# Patient Record
Sex: Male | Born: 1945 | Race: White | Hispanic: No | Marital: Married
Health system: Midwestern US, Community
[De-identification: ages and names within clinical notes are randomized; demographics above are authoritative.]

## PROBLEM LIST (undated history)

## (undated) DIAGNOSIS — C61 Malignant neoplasm of prostate: Principal | ICD-10-CM

## (undated) DIAGNOSIS — R972 Elevated prostate specific antigen [PSA]: Secondary | ICD-10-CM

---

## 2008-04-10 DEATH — deceased

## 2016-10-21 NOTE — Discharge Instructions (Signed)
Do NOT take the following medications on the morning of surgery ATORVASTATIN    TAKE the following medications the morning of your surgery FOLLOW UP WITH DR HULL ABOUT EYE DROPS FOR MORNING OF SURGERY    You may take your prescription pain medications.  You may take Tylenol (Acetaminophen) if needed for pain.    No Motrin, Ibuprofen, or Advil 24 hours prior to surgery, or longer if instructed by your surgeon.     No Aleve or Naprosyn 3 days prior to surgery, or longer if instructed by your surgeon.    Additional instructions FOLLOW ALL INSTRUCTIONS GIVEN TO YOU BY YOUR PHYSICIAN    You will receive a reminder call the day before surgery with your Same Day Surgery arrival time.    If you have specific questions, please call your surgeon.

## 2016-10-21 NOTE — H&P (Signed)
Comprehensive PreSurgical History and Physical      Name: Maurice Baker MRN: [id number] DOB: [birth year]    (Age-71 y.o.)     Date of Service: Pt seen/examined on 10/21/2016     Chief Complaint:  Epiretinal membrane, left eye    History Of Present Illness:    71 y.o. male who we are asked to see/evaluate by [name], MD for pre-operative evaluation prior to Procedure Notes:  [place] LEFT EYE  MAC ANESTHESIA.    Patient is established with Dr. [name] for epiretinal membrane, left eye. He complains of visions changes since his cataract surgery July 2018. He has elected for the above procedure.    Patient denies chest pain on exertion, palpitations, or SOB. Patient denies any recent fevers, illness, infection, or wounds.    Past Medical History:        Diagnosis Date   . Hyperlipidemia    . Seizure [facility])     as child     Past Surgical History:        Procedure Laterality Date   . CATARACT REMOVAL Left    . COLONOSCOPY     . RETINAL LASER Left        Medications Prior to Admission:    Prior to Admission medications    Medication Sig Start Date End Date Taking? Authorizing Provider   prednisoLONE acetate (PRED FORTE) 1 % ophthalmic suspension Place 1 drop into the left eye 4 times daily   Yes Historical Provider, MD   ketorolac (ACULAR) 0.5 % ophthalmic solution Place 1 drop into the left eye 4 times daily   Yes Historical Provider, MD   bimatoprost (LUMIGAN) 0.01 % SOLN ophthalmic drops Place 1 drop into both eyes nightly   Yes Historical Provider, MD   atorvastatin (LIPITOR) 10 MG tablet Take 10 mg by mouth daily   Yes Historical Provider, MD   Timolol (TIMOPTIC) 0.5 % (DAILY) SOLN ophthalmic solution 1 drop daily   Yes Historical Provider, MD   Polyvinyl Alcohol-Povidone (REFRESH OP) Apply to eye 2 times daily   Yes Historical Provider, MD       ANTICOAGULATION: No  CHRONIC STEROID USE:  No  CHRONIC NARCOTIC USE:  No    Allergies:  Patient has no known allergies.    If patient has opioid allergy, is it okay to take Acetaminophen: N/A      Social History:   TOBACCO:   reports that he has never smoked. He has never used smokeless tobacco.  ETOH:   reports that he drinks about 1.2 oz of alcohol per week .  History   Drug Use No        Family History:  History reviewed. No pertinent family history.    REVIEW OF SYSTEMS:  Pertinent positives as noted in the HPI. All other systems reviewed and negative.    PHYSICAL EXAM:  Vitals:  BP (!) 146/91   Pulse 56   Temp 98.7 F (37.1 C) (Temporal)   Resp 18   Ht _0  (1.854 m)   Wt 212 lb (96.2 kg)   SpO2 97%   BMI 27.97 kg/m   BMI Classification:  Overweight (BMI 25.0-29.9)  General appearance: No apparent distress, appears stated age and cooperative.  HEENT: Normal cephalic, atraumatic without obvious deformity. Pupils equal, round, and reactive to light.  Extra ocular muscles intact. Conjunctivae/corneas clear.  Neck: No jugular venous distention. Trachea midline.  Respiratory:  Normal respiratory effort. Clear to auscultation, bilaterally without Rales/Wheezes/Rhonchi.  Cardiovascular: Regular rate and rhythm with normal S1/S2 without murmurs, rubs or gallops.  Abdomen: Soft, non-tender, non-distended with normal bowel sounds.  Musculoskeletal: No clubbing, cyanosis or edema bilaterally. Full ROM of all extremities.   Skin: Skin color, texture, turgor normal.  No rashes or lesions.  Neurologic:  Neurovascularly intact without any focal sensory/motor deficits. Cranial nerves: II-XII intact, grossly non-focal.  Psychiatric: Alert and oriented, thought content appropriate, normal insight      Labs:   No results found for: WBC, HGB, HCT, MCV, PLT  No results found for: NA, K, CL, CO2, BUN, CREATININE, GLUCOSE, CALCIUM, PROT, LABALBU, BILITOT, ALKPHOS, AST, ALT, LABGLOM, GFRAA, AGRATIO, GLOB    Lee's Simple Cardiac Risk Index:  High-risk surgery (intraperitoneal, intrathoracic or  suprainguinal vascular surgery): no  Coronary artery disease: no  Congestive heart failure: no  History of cerebrovascular disease: no  Insulin treatment for diabetes mellitus: no  Preoperative serum creatinine > 2.44m/dL: no    Total:   Interpretation:  0 Points Class I   1 Point Class II   2 Points Class III   >=3 Points Class IV         OSA Screening (STOP-Bang)  DO NOT COMPLETE IF ALREADY DIAGNOSED WITH OSA  Do you snore loudly (loud enough to be heard through closed doors, or your bed partner elbows you for snoring at night)?no    Do you often feel tired, fatigued, or sleepy during the daytime (such as falling asleep during driving)?yes - 1 Point    Has anyone observed you stop breathing or choking/gasping during your sleep?no    Do you have or are being treated for high blood pressure? no    Body mass index more than 35 kg/m2? Body mass index is 27.97 kg/m. no    Age older than 71years old? 71y.o. yes - 1 Point    Neck size large? (measured around Adam's apple)  For male, is your shirt collar 17 inches or larger?  no  For male, is your shirt collar 16 inches or larger?no    Gender = Male? male yes - 1 Point    STOP BANG SCORE = 3      Information from PAT significant to anesthetic history as recorded by NMechele Claude PA-C  on 10/21/2016        Anesthesia Component:    Any problems with anesthesia?: Past General Anesthetic without complications  History of difficult intubation?: None reported  History of difficult IV access?: None reported  Family History of problems with anesthesia?: None noted   Risk Factors PONV:    Male: no  Motion sick or prior PONV: no  NonSmoker: Nonsmoker - (+1)  Opiods for Procedure: Yes (+1)            Airway and Dentition Patient PAT Education - Risk Assesment      ASA Score:ASA 2 - Social alcohol  Mallampati  PAT assesment: 1        TMD: >4cm  Mouth Size: WNL  Neck: WNL  Dentures: None  Partials: permanent upper partial  Overall Dentition: Teeth intact with none loose/chipped  per patient  MAC anesthesia planned      GERD:None      Frailty Risk Assessment: FRAILTY RISK IF >639years old - Pt is 71y.o.               No Risk Factors unless listed   Patient  received procedure specific education prior to surgery per nursing     Tobacco:Patient not a current smoker    ETOH:  reports that he drinks about 1.2 oz of alcohol per week .  Drug:   History   Drug Use No        Functional Capacity: (>4 METS implies low cardiovascular risk from surgery):  Climb a flight of stairs or walk up a hill (5.50 METs)    PAT Pain Score:None     Postop Pain Management Plan (Pain consult ordered?): Pain consult not indicated at this time              EKG:  I have reviewed the EKG if indicated and there is no concern for ischemic changes or significant heart block.    ECHO and VQ:MGQQ on file       ASSESSMENT/PLAN:  Patient is considered low/intermediate  risk for this low/intermediate risk procedure/surgery (Procedure Notes:  25 GAUGE PARS PLANA VITRECTOMY EPIRETINAL MEMBRANE DISSECTION WITHINJECTION OF TRIESENCE LEFT EYE  MAC ANESTHESIA) with no reducible risk factors. Based on the above evaluation, the benefits of the planned procedure likely exceed the risks.  The patient is medically optimized to proceed with the planned procedure without any further cardiopulmonary testing.    1)  Epiretinal membrane, left eye  Deferred to surgeon.    2)  Elevated BP  Repeat BP lower. Patient is anxious for procedure. Patient follows his BP with his PCP and he has never been on any BP medication.  BP Readings from Last 3 Encounters:   10/21/16 (!) 146/91     3)  HLD  Statin therapy.    Labs Ordered:  NO - NOT INDICATED FOR THIS PROCEDURE  ECG Ordered:  NO - NOT INDICATED FOR THIS PROCEDURE  Sleep Referral Ordered:  No, patient declined OSA referral at this time.        Electronically signed by: Mechele Claude, PA-C     Date: 10/21/2016 at 1:39 PM               PAT Protocol referenced includes:  1) Anesthesia Lab Protocol  Orders  2) Perioperative Cardiovascular Risk Assessment  3) Anesthesia Assessment   4) Pain Assessment and Acute Pain Service Consult (if appropriate)  5) Medical Clearance/Consult from Internal Medicine ([place])  6) Shower/Wash Order (for designated surgeries)  7) OSA Screen and Sleep Clinic Referral (if appropriate)

## 2016-10-21 NOTE — Other (Unsigned)
Patient Acct Nbr: [id number]   Primary AUTH/CERT:   [place] Name: [place] name: [facility]  Primary Insurance Group Number: [id number]  Primary Insurance Plan Type: Health  Primary Insurance Policy Number: [id number]

## 2016-10-21 NOTE — H&P (View-Only) (Signed)
Comprehensive PreSurgical History and Physical      Name: Maurice Baker MRN: [id number] DOB: 04-Dec-1945    (Age-71 y.o.)     Date of Service: Pt seen/examined on 10/21/2016     Chief Complaint:  Epiretinal membrane, left eye    History Of Present Illness:    71 y.o. male who we are asked to see/evaluate by [name], MD for pre-operative evaluation prior to Procedure Notes:  [name] LEFT EYE  MAC ANESTHESIA.    Patient is established with Dr. [name] for epiretinal membrane, left eye. He complains of visions changes since his cataract surgery July 2018. He has elected for the above procedure.    Patient denies chest pain on exertion, palpitations, or SOB. Patient denies any recent fevers, illness, infection, or wounds.    Past Medical History:        Diagnosis Date   ??? Hyperlipidemia    ??? Seizure ([name])     as child     Past Surgical History:        Procedure Laterality Date   ??? CATARACT REMOVAL Left    ??? COLONOSCOPY     ??? RETINAL LASER Left        Medications Prior to Admission:    Prior to Admission medications    Medication Sig Start Date End Date Taking? Authorizing Provider   prednisoLONE acetate (PRED FORTE) 1 % ophthalmic suspension Place 1 drop into the left eye 4 times daily   Yes Historical Provider, MD   ketorolac (ACULAR) 0.5 % ophthalmic solution Place 1 drop into the left eye 4 times daily   Yes Historical Provider, MD   bimatoprost (LUMIGAN) 0.01 % SOLN ophthalmic drops Place 1 drop into both eyes nightly   Yes Historical Provider, MD   atorvastatin (LIPITOR) 10 MG tablet Take 10 mg by mouth daily   Yes Historical Provider, MD   Timolol (TIMOPTIC) 0.5 % (DAILY) SOLN ophthalmic solution 1 drop daily   Yes Historical Provider, MD   Polyvinyl Alcohol-Povidone (REFRESH OP) Apply to eye 2 times daily   Yes Historical Provider, MD       ANTICOAGULATION: No  CHRONIC STEROID USE:  No  CHRONIC NARCOTIC USE:  No    Allergies:  Patient has no known allergies.    If patient has opioid allergy, is it okay to take Acetaminophen: N/A      Social History:   TOBACCO:   reports that he has never smoked. He has never used smokeless tobacco.  ETOH:   reports that he drinks about 1.2 oz of alcohol per week .  History   Drug Use No        Family History:  History reviewed. No pertinent family history.    REVIEW OF SYSTEMS:  Pertinent positives as noted in the HPI. All other systems reviewed and negative.    PHYSICAL EXAM:  Vitals:  BP (!) 146/91    Pulse 56    Temp 98.7 ??F (37.1 ??C) (Temporal)    Resp 18    Ht _0  (1.854 m)    Wt 212 lb (96.2 kg)    SpO2 97%    BMI 27.97 kg/m??   BMI Classification:  Overweight (BMI 25.0-29.9)  General appearance: No apparent distress, appears stated age and cooperative.  HEENT: Normal cephalic, atraumatic without obvious deformity. Pupils equal, round, and reactive to light.  Extra ocular muscles intact. Conjunctivae/corneas clear.  Neck: No  jugular venous distention. Trachea midline.  Respiratory:  Normal respiratory effort. Clear to auscultation, bilaterally without Rales/Wheezes/Rhonchi.  Cardiovascular: Regular rate and rhythm with normal S1/S2 without murmurs, rubs or gallops.  Abdomen: Soft, non-tender, non-distended with normal bowel sounds.  Musculoskeletal: No clubbing, cyanosis or edema bilaterally. Full ROM of all extremities.   Skin: Skin color, texture, turgor normal.  No rashes or lesions.  Neurologic:  Neurovascularly intact without any focal sensory/motor deficits. Cranial nerves: II-XII intact, grossly non-focal.  Psychiatric: Alert and oriented, thought content appropriate, normal insight      Labs:   No results found for: WBC, HGB, HCT, MCV, PLT  No results found for: NA, K, CL, CO2, BUN, CREATININE, GLUCOSE, CALCIUM, PROT, LABALBU, BILITOT, ALKPHOS, AST, ALT, LABGLOM, GFRAA, AGRATIO, GLOB    Lee's Simple Cardiac Risk Index:  High-risk surgery (intraperitoneal, intrathoracic or  suprainguinal vascular surgery): no  Coronary artery disease: no  Congestive heart failure: no  History of cerebrovascular disease: no  Insulin treatment for diabetes mellitus: no  Preoperative serum creatinine > 2.19m/dL: no    Total:   Interpretation:  0 Points Class I   1 Point Class II   2 Points Class III   >=3 Points Class IV         OSA Screening (STOP-Bang)  DO NOT COMPLETE IF ALREADY DIAGNOSED WITH OSA  Do you snore loudly (loud enough to be heard through closed doors, or your bed partner elbows you for snoring at night)?no    Do you often feel tired, fatigued, or sleepy during the daytime (such as falling asleep during driving)?yes - 1 Point    Has anyone observed you stop breathing or choking/gasping during your sleep?no    Do you have or are being treated for high blood pressure? no    Body mass index more than 35 kg/m2? Body mass index is 27.97 kg/m??. no    Age older than 71years old? 71y.o. yes - 1 Point    Neck size large? (measured around Adam's apple)  For male, is your shirt collar 17 inches or larger?  no  For male, is your shirt collar 16 inches or larger?no    Gender = Male? male yes - 1 Point    STOP BANG SCORE = 3      Information from PAT significant to anesthetic history as recorded by NMechele Claude PA-C  on 10/21/2016        Anesthesia Component:    Any problems with anesthesia?: Past General Anesthetic without complications  History of difficult intubation?: None reported  History of difficult IV access?: None reported  Family History of problems with anesthesia?: None noted   Risk Factors PONV:    Male: no  Motion sick or prior PONV: no  NonSmoker: Nonsmoker - (+1)  Opiods for Procedure: Yes (+1)            Airway and Dentition Patient PAT Education - Risk Assesment      ASA Score:ASA 2 - Social alcohol  Mallampati  PAT assesment: 1        TMD: >4cm  Mouth Size: WNL  Neck: WNL  Dentures: None  Partials: permanent upper partial  Overall Dentition: Teeth intact with none loose/chipped  per patient  MAC anesthesia planned      GERD:None      Frailty Risk Assessment: FRAILTY RISK IF >641years old - Pt is 71y.o.  No Risk Factors unless listed   Patient received procedure specific education prior to surgery per nursing     Tobacco:Patient not a current smoker    ETOH:  reports that he drinks about 1.2 oz of alcohol per week .  Drug:   History   Drug Use No        Functional Capacity: (>4 METS implies low cardiovascular risk from surgery):  Climb a flight of stairs or walk up a hill (5.50 METs)    PAT Pain Score:None     Postop Pain Management Plan (Pain consult ordered?): Pain consult not indicated at this time              EKG:  I have reviewed the EKG if indicated and there is no concern for ischemic changes or significant heart block.    ECHO and WF:UXNA on file       ASSESSMENT/PLAN:  Patient is considered low/intermediate  risk for this low/intermediate risk procedure/surgery (Procedure Notes:  25 GAUGE PARS PLANA VITRECTOMY EPIRETINAL MEMBRANE DISSECTION WITHINJECTION OF TRIESENCE LEFT EYE  MAC ANESTHESIA) with no reducible risk factors. Based on the above evaluation, the benefits of the planned procedure likely exceed the risks.  The patient is medically optimized to proceed with the planned procedure without any further cardiopulmonary testing.    1)  Epiretinal membrane, left eye  Deferred to surgeon.    2)  Elevated BP  Repeat BP lower. Patient is anxious for procedure. Patient follows his BP with his PCP and he has never been on any BP medication.  BP Readings from Last 3 Encounters:   10/21/16 (!) 146/91     3)  HLD  Statin therapy.    Labs Ordered:  NO - NOT INDICATED FOR THIS PROCEDURE  ECG Ordered:  NO - NOT INDICATED FOR THIS PROCEDURE  Sleep Referral Ordered:  No, patient declined OSA referral at this time.        Electronically signed by: Mechele Claude, PA-C     Date: 10/21/2016 at 1:39 PM               PAT Protocol referenced includes:  1) Anesthesia Lab Protocol  Orders  2) Perioperative Cardiovascular Risk Assessment  3) Anesthesia Assessment   4) Pain Assessment and Acute Pain Service Consult (if appropriate)  5) Medical Clearance/Consult from Internal Medicine ([place])  6) Shower/Wash Order (for designated surgeries)  7) OSA Screen and Sleep Clinic Referral (if appropriate)

## 2016-10-30 MED ORDER — FAMOTIDINE 20 MG PO TABS
20 MG | Freq: Once | ORAL | Status: AC
Start: 2016-10-30 — End: 2016-10-30
  Administered 2016-10-30: 14:00:00 20 mg via ORAL

## 2016-10-30 MED ORDER — OXYCODONE HCL 5 MG PO TABS
5 MG | ORAL | Status: DC | PRN
Start: 2016-10-30 — End: 2016-10-30

## 2016-10-30 MED ORDER — NORMAL SALINE FLUSH 0.9 % IV SOLN
0.9 % | INTRAVENOUS | Status: DC | PRN
Start: 2016-10-30 — End: 2016-10-30

## 2016-10-30 MED ORDER — HYDROMORPHONE HCL 1 MG/ML IJ SOLN
1 MG/ML | INTRAMUSCULAR | Status: DC | PRN
Start: 2016-10-30 — End: 2016-10-30

## 2016-10-30 MED ORDER — PROPOFOL 200 MG/20ML IV EMUL
200 | INTRAVENOUS | Status: AC
Start: 2016-10-30 — End: 2016-10-30

## 2016-10-30 MED ORDER — LACTATED RINGERS IV SOLN
INTRAVENOUS | Status: DC
Start: 2016-10-30 — End: 2016-10-30
  Administered 2016-10-30: 14:00:00 via INTRAVENOUS

## 2016-10-30 MED ORDER — FENTANYL CITRATE (PF) 100 MCG/2ML IJ SOLN
100 MCG/2ML | INTRAMUSCULAR | Status: DC | PRN
Start: 2016-10-30 — End: 2016-10-30

## 2016-10-30 MED ORDER — ACETAMINOPHEN 500 MG PO TABS
500 MG | Freq: Once | ORAL | Status: AC
Start: 2016-10-30 — End: 2016-10-30
  Administered 2016-10-30: 14:00:00 1000 mg via ORAL

## 2016-10-30 MED ORDER — LABETALOL HCL 5 MG/ML IV SOLN
5 MG/ML | INTRAVENOUS | Status: DC | PRN
Start: 2016-10-30 — End: 2016-10-30

## 2016-10-30 MED ORDER — HYDRALAZINE HCL 20 MG/ML IJ SOLN
20 MG/ML | INTRAMUSCULAR | Status: DC | PRN
Start: 2016-10-30 — End: 2016-10-30

## 2016-10-30 MED ORDER — PHENYLEPHRINE HCL 10 % OP SOLN
10 % | OPHTHALMIC | Status: AC
Start: 2016-10-30 — End: 2016-10-30
  Administered 2016-10-30 (×3): 1 [drp] via OPHTHALMIC

## 2016-10-30 MED ORDER — ONDANSETRON HCL 4 MG/2ML IJ SOLN
4 MG/2ML | Freq: Once | INTRAMUSCULAR | Status: DC | PRN
Start: 2016-10-30 — End: 2016-10-30

## 2016-10-30 MED ORDER — PROMETHAZINE HCL 25 MG/ML IJ SOLN
25 MG/ML | Freq: Once | INTRAMUSCULAR | Status: DC | PRN
Start: 2016-10-30 — End: 2016-10-30

## 2016-10-30 MED ORDER — ATROPINE SULFATE 1 % OP SOLN
1 % | OPHTHALMIC | Status: AC
Start: 2016-10-30 — End: 2016-10-30
  Administered 2016-10-30 (×3): 1 [drp] via OPHTHALMIC

## 2016-10-30 MED ORDER — NORMAL SALINE FLUSH 0.9 % IV SOLN
0.9 % | Freq: Two times a day (BID) | INTRAVENOUS | Status: DC
Start: 2016-10-30 — End: 2016-10-30

## 2016-10-30 MED ORDER — MEPERIDINE HCL 25 MG/ML IJ SOLN
25 MG/ML | INTRAMUSCULAR | Status: DC | PRN
Start: 2016-10-30 — End: 2016-10-30

## 2016-10-30 MED ORDER — ONDANSETRON HCL 4 MG/2ML IJ SOLN
4 | INTRAMUSCULAR | Status: AC
Start: 2016-10-30 — End: 2016-10-30

## 2016-10-30 MED ORDER — LIDOCAINE HCL (PF) 1 % IJ SOLN
1 % | Freq: Once | INTRAMUSCULAR | Status: DC | PRN
Start: 2016-10-30 — End: 2016-10-30

## 2016-10-30 MED ORDER — DIPHENHYDRAMINE HCL 50 MG/ML IJ SOLN
50 MG/ML | Freq: Once | INTRAMUSCULAR | Status: DC | PRN
Start: 2016-10-30 — End: 2016-10-30

## 2016-10-30 MED ORDER — CYCLOPENTOLATE HCL 2 % OP SOLN
2 % | OPHTHALMIC | Status: AC
Start: 2016-10-30 — End: 2016-10-30
  Administered 2016-10-30 (×3): 1 [drp] via OPHTHALMIC

## 2016-10-30 MED ORDER — ALPRAZOLAM 0.25 MG PO TBDP
0.25 MG | ORAL | Status: DC | PRN
Start: 2016-10-30 — End: 2016-10-30

## 2016-10-30 MED FILL — ISOPTO ATROPINE 1 % OP SOLN: 1 % | OPHTHALMIC | Qty: 5

## 2016-10-30 MED FILL — PHENYLEPHRINE HCL 10 % OP SOLN: 10 % | OPHTHALMIC | Qty: 5

## 2016-10-30 MED FILL — PROPOFOL 200 MG/20ML IV EMUL: 200 MG/20ML | INTRAVENOUS | Qty: 20

## 2016-10-30 MED FILL — MAPAP 500 MG PO TABS: 500 MG | ORAL | Qty: 2

## 2016-10-30 MED FILL — ONDANSETRON HCL 4 MG/2ML IJ SOLN: 4 MG/2ML | INTRAMUSCULAR | Qty: 2

## 2016-10-30 MED FILL — BSS IO SOLN: INTRAOCULAR | Qty: 500

## 2016-10-30 MED FILL — FAMOTIDINE 20 MG PO TABS: 20 MG | ORAL | Qty: 1

## 2016-10-30 MED FILL — VANCOMYCIN HCL 500 MG IV SOLR: 500 MG | INTRAVENOUS | Qty: 5

## 2016-10-30 MED FILL — CYCLOPENTOLATE HCL 2 % OP SOLN: 2 % | OPHTHALMIC | Qty: 2

## 2016-10-30 NOTE — Anesthesia Post-Procedure Evaluation (Signed)
s                                       Department of Anesthesiology                             Post Anesthesia Note    Name: Maurice Baker MRN: [id number] DOB: Apr 14, 1945    (Age-71 y.o.)     This note was authored with review of the notes of the PACU and/or Same day stay and/or direct communication with the patient with an anesthesia care provider.     POST  ANESTHESIA  EVALUATION    No data found.       BP (!) 141/85    Pulse 51    Temp 97.1 ??F (36.2 ??C) (Tympanic)    Resp 20    Ht 6\' 1"  (1.854 m)    Wt 212 lb (96.2 kg)    SpO2 100%    BMI 27.97 kg/m??      Pain Level: 0    Vital signs: Respiratory and Cardiovascular function within normal limits (See Nursing Record)  Level of consciousness: Patient awake/ Able to participate      Return to baseline mental status: Yes Airway status: Normal   Pain controlled: Yes Dental injury: No   Nausea/Vomiting controlled: Yes Complications: no   Well hydrated: Yes      Patient Experience comments: None expressed    Electronically signed by Darrall Dears, MD on 10/31/2016 at 8:15 AM

## 2016-10-30 NOTE — Progress Notes (Signed)
Patient arrived and ID verified. Vital signs stable. Call light in reach. Denies nausea and vomiting. Offered crackers and something to drink. Belonging returned to the patient.

## 2016-10-30 NOTE — Interval H&P Note (Signed)
I personally reviewed the H&P     I agree with the stated history and physical. There are no interval changes to add at this time. Proceed with planned surgery as scheduled.    PHYSICIAN ATTESTATION    I affirm that prior to performing the procedure noted herein, that I have discussed with my patient risks, benefits and alternatives.  In addition, I have confirmed:    [x]  The  Patient's identification   [x]  The procedure to be performed   [x]  The use of moderate sedation or anesthesia   [x]  The surgical site has been marked when applicable   [x]  The patient has been assessed pre-sedation/pre-procedure and there are no changes       Electronically signed by: [name], MD     Date: 10/30/2016 at 9:26 AM

## 2016-10-30 NOTE — Other (Unsigned)
Patient Acct Nbr: [id number]   Primary AUTH/CERT:   [place] Name: [place] name: [facility]  Primary Insurance Group Number: [id number]  Primary Insurance Plan Type: Health  Primary Insurance Policy Number: [id number]

## 2016-10-30 NOTE — Brief Op Note (Signed)
Department of Ophthalmology  Brief Operative Report    Patient Name: Maurice Baker  Date of Birth: 1945-12-03  MRN: [id number]    Pre-operative Diagnosis:  Epiretinal Membrane and OS    Post-operative Diagnosis: same as preoperative diagnosis    Procedure:  Injection triescence, PPV, ERMD, GFX and OS    Surgeon:  Julaine Fusi, MD     Assistant(s):    Anesthesia:  MAC    Estimated blood loss:  Minimal    Specimens:  None    Implants:  None      Findings:  None    Complications:  None    Condition:  stable    See dictated operative report for full details.    [name], MD

## 2016-10-30 NOTE — Discharge Instructions (Signed)
DO NOT:  1. Remove shield unless instructed to place eye drops  2. Bathe until seen by physician  3. Bend beneath waist or lift greater than 10 pounds  4. Take aspirin until seen by physician  5. Drive until directed by physician  Position: Any position post-op    Ok to take tylenol as directed as needed    Start home medications as directed by primary care physician    Follow up in office next day as directed.

## 2016-10-30 NOTE — Op Note (Signed)
Maurice Baker, Maurice Baker  MEDICAL RECORD #:         [id number]  ADMISSION DATE:           10/30/2016  SURGERY DATE:             10/30/2016  ACCOUNT #:                [id number]  DATE OF BIRTH:            12-28-1945  AGE:                      71  ADMITTING PHYSICIAN:      Marcello Moores P. [name], MD  ATTENDING PHYSICIAN:      Dorina Hoyer. [name], MD  DICTATING PHYSICIAN:      Dorina Hoyer. [name], MD                               OPERATIVE RECORD      Procedures:  PARS PLANA VITRECTOMY, EPIRETINAL MEMBRANE DISSECTION,  INTERNAL LIMITING MEMBRANE DISSECTION, INJECTION INTRAVITREAL  TRIAMCINOLONE TO THE LEFT EYE.    Preoperative Diagnosis:  Macular epiretinal membrane, secondary  macular edema of the left eye.    Postoperative Diagnosis:  Macular epiretinal membrane, secondary  macular edema of the left eye.    Anesthesia:  Monitored local.    Assistant:  Benedict Needy.    Description of Procedure:  The patient was taken to the operating room  in the recumbent position on eye cart.  He was given retrobulbar  anesthetic to the left eye consisting of a half and half mixture of 2%  Xylocaine and 0.75% Marcaine.  Left eye was then prepped and draped in  the normal sterile fashion for eye surgery.  With the aid of the  operating room microscope, a lid speculum was placed into the left  eye.  Using 25-gauge sutureless vitrectomy ports, an infusion line was  placed in the inferotemporal quadrant, 3.5 mm posterior to the limbus.  It was visualized in the anterior segment and then turned on.  Two  additional ports were created at 9:30 and 2:30 positions, again 3.5 mm  posterior to the limbus.  The Zeiss wide field viewing system was  placed into position.  Light pipe and vitrector introduced to the eye.  Core vitrectomy was then performed.  Following removal of the core  vitreous as noted to the posterior hyaloid, it was still attached over  the central retinal surface.  Suction port of vitreous cutter was used  to engage and  strip the hyaloid from the posterior retinal surface.  Residual vitreous was then trimmed back.  ICG dye was then instilled  onto the retinal surface and then immediately removed.  A Tano diamond  dusted membrane scraper was used to engage and elevate an edge of  epiretinal membrane tissue.  The ERM was then peeled off the central  macular surface using end grasping forceps.  Tano scraper was then  used to engage and elevate internal limiting membrane.  The ILM was  also peeled off the retinal surface using end grasping forceps.  Indirect ophthalmoscope was then used to inspect the peripheral  retina.  There was no evidence of any peripheral retinal breaks.  A  20% air-fluid exchange was performed and 4 mg of Kenalog was  then  injected into the vitreous cavity.  The plugs were then removed.  The  wounds were found to be airtight and the pressure was noted to be  within normal limits.  Maxitrol ointment was placed topically on the  eye.  Pressure dressing was applied.  The patient was then taken to  the recovery area.    Diskriter Job ID: [id number]        Dorina Hoyer. [name], MD    DOD:10/30/2016 12:50 P  TH/dsk  DOT:10/30/2016 01:20 P  Job Number: [id number]  Document Number: [id number]  cc:   Misk Galentine P. [name], MD        [facility]        [address]        [place] [address]

## 2016-10-30 NOTE — Progress Notes (Signed)
Discharge information given to the patient. Patient and family verbalized understanding of information. All questions were answered before discharge. Patient ambulated, denies dizziness or nausea. Tolerating PO fluids and crackers. Vital signs are stable. Patient has changed and is being discharged home in a wheelchair with valuables.

## 2016-10-31 MED FILL — BSS IO SOLN: INTRAOCULAR | Qty: 30

## 2016-10-31 MED FILL — KENALOG 40 MG/ML IJ SUSP: 40 MG/ML | INTRAMUSCULAR | Qty: 1

## 2016-10-31 MED FILL — SENSORCAINE-MPF 0.75 % IJ SOLN: 0.75 % | INTRAMUSCULAR | Qty: 10

## 2016-10-31 MED FILL — LIDOCAINE HCL 2 % IJ SOLN: 2 % | INTRAMUSCULAR | Qty: 20

## 2016-10-31 MED FILL — BETADINE OPHTHALMIC PREP 5 % OP SOLN: 5 % | OPHTHALMIC | Qty: 30

## 2016-10-31 MED FILL — NEOMYCIN-POLYMYXIN-DEXAMETH 3.5-10000-0.1 OP OINT: OPHTHALMIC | Qty: 3.5

## 2016-10-31 MED FILL — IC GREEN 25 MG IV SOLR: 25 MG | INTRAVENOUS | Qty: 10

## 2017-01-01 ENCOUNTER — Ambulatory Visit: Admit: 2017-01-01 | Discharge: 2017-01-01 | Payer: MEDICARE | Attending: Urology

## 2017-01-01 DIAGNOSIS — R972 Elevated prostate specific antigen [PSA]: Secondary | ICD-10-CM

## 2017-01-01 NOTE — Progress Notes (Signed)
Maurice Romp, MD        01/01/2017 at 2:46 PM    UROLOGY INITIAL OFFICE  VISIT       PATIENT NAME: Maurice Baker   DATE OF BIRTH: 1945-11-16     TODAY'S DATE: 01/01/2017    Impression:    1. Elevated PSA  - PSA, Total and Free; Future  - PSA, Total and Free; Future    2. BPH with urinary obstruction       Plan:    1. Free and total psa now and in June     Return for free and total psa now and early June, follow up in June.        Chief Complaint:    Chief Complaint   Patient presents with   . New Patient   . Elevated PSA       HPI            Maurice Baker  is a 71 y.o. male who presents with elevated psa 6.3. His prior psa have been in the 2-3 range. He recently started flomax. He does have weak stream but denies nocturia hematuria uti or incontinence. He denies perineal pain.        Review of Systems   Constitutional: Negative for chills, fever and unexpected weight change.   Cardiovascular: Negative for leg swelling.   Gastrointestinal: Negative for nausea and vomiting.   Genitourinary: Positive for difficulty urinating. Negative for frequency and urgency.   All other systems reviewed and are negative.        Past Medical History:        Diagnosis Date   . Hyperlipidemia    . Seizure [facility])     as child       Past Surgical History:        Procedure Laterality Date   . CATARACT REMOVAL Left    . COLONOSCOPY     . OTHER SURGICAL HISTORY  10/30/2016    Injection triescence, PPV, ERMD, GFX and OS   . RETINAL LASER Left        Current Medications:   Prior to Admission medications    Medication Sig Start Date End Date Taking? Authorizing Provider   tamsulosin (FLOMAX) 0.4 MG capsule Take 0.4 mg by mouth daily   Yes Historical Provider, MD   bimatoprost (LUMIGAN) 0.01 % SOLN ophthalmic drops Place 1 drop into both eyes nightly   Yes Historical Provider, MD   atorvastatin (LIPITOR) 10 MG tablet Take 10 mg by mouth daily   Yes Historical Provider, MD   Timolol (TIMOPTIC) 0.5 % (DAILY) SOLN ophthalmic solution 1 drop  daily   Yes Historical Provider, MD   ketorolac (ACULAR) 0.5 % ophthalmic solution Place 1 drop into the left eye 4 times daily    Historical Provider, MD        Allergies:  Patient has no known allergies.    Social History:  Social History     Social History   . Marital status: Married     Spouse name: N/A   . Number of children: N/A   . Years of education: N/A     Occupational History   . Not on file.     Social History Main Topics   . Smoking status: Never Smoker   . Smokeless tobacco: Never Used   . Alcohol use 1.2 oz/week     2 Cans of beer per week      Comment: daily   .  Drug use: No   . Sexual activity: Not on file     Other Topics Concern   . Not on file     Social History Narrative   . No narrative on file       Family History:    History reviewed. No pertinent family history.    VITALS:  BP 129/87   Pulse 100   Ht 6' (1.829 m)   Wt 210 lb (95.3 kg)   BMI 28.48 kg/m     Physical Exam    General Appearance: alert and oriented to person, place and time, well developed and well- nourished, in no acute distress  Skin: warm and dry, no rash or erythema  Head: normocephalic and atraumatic  Eyes: pupils equal, round, extraocular eye movements intact, conjunctivae normal  ENT:  external ear and ear canal normal bilaterally, nose without deformity  Neck: supple and non-tender without mass, no thyromegaly or thyroid nodules, no cervical lymphadenopathy  Pulmonary/Chest:  normal air movement, no respiratory distress  Cardiovascular: normal rate, regular rhythm, normal, distal pulses intact  Abdomen: soft, non-tender, non-distended, no masses or organomegaly  Genitourinary/Rectal: genitals normal without hernia or inguinal adenopathy,testicles right some what high riding but palpable descended no masses or tenderness,penis without lesion or discharge, rectal normal without masses, prostate normal in size without masses or nodules  Extremities: no cyanosis, clubbing or edema  Musculoskeletal: normal range of  motion,no deformity or tenderness  Neurologic:  behavior judgement and speech normal      DATA:    LABS:  No results found for: PSAFREE, PSAFREEPCT  No results for input(s): PSAFREE, PSAFREEPCT in the last 72 hours.  No results found for: TESTOSTERONE  No results found for: WBC, HGB, HCT, MCV, PLT  No results found for: NA, K, CL, CO2, BUN, CREATININE, GLUCOSE, CALCIUM   No results found for: LABURIN    No results found for this visit on 01/01/17.      Radiology Review:             Maurice Romp, MD   01/01/17   2:46 PM

## 2017-01-07 NOTE — Telephone Encounter (Signed)
Pt called for PSA results. See media.

## 2017-01-07 NOTE — Telephone Encounter (Signed)
Please let patient know PSA came back as 4.4. This is down from his previous result, I would f/u as scheduled with Dr. Myra Gianotti with new PSA at that time. Nothing else needed right now. Thank you.

## 2017-01-08 ENCOUNTER — Encounter

## 2017-01-08 NOTE — Telephone Encounter (Signed)
Patient informed

## 2018-10-04 ENCOUNTER — Ambulatory Visit: Admit: 2018-10-04 | Discharge: 2018-10-04 | Payer: MEDICARE | Attending: Urology

## 2018-10-04 DIAGNOSIS — R972 Elevated prostate specific antigen [PSA]: Secondary | ICD-10-CM

## 2018-10-04 NOTE — Progress Notes (Signed)
Brita Romp, MD      Office follow up          PATIENT NAME: Maurice Baker   DATE OF BIRTH: Aug 03, 1945       TODAY'S DATE: 10/04/2018        Maurice Baker   is a 73 y.o. male  with   1. Elevated but stable psa. Appropriate for age and prostate size.  2. bph better on flomax    ?? psa in December, bx for rise psa  ?? flomax for bph  ?? rto 60mo      CHIEF COMPLAINT:  Chief Complaint   Patient presents with   ??? Other     elevated psa       Subjective:   Mr. Maurice Baker is a 74 y.o. male.         10/04/2018  psa 09/06/18--5.8  He was last seen 12/2016 his psa then was 6.3  He was started on flomax 09/09/18 and stream weak but it is better. No nocturia.  No fhx pca.      Review of Systems   Constitutional: Negative for chills, fever and unexpected weight change.   Cardiovascular: Negative for leg swelling.   Gastrointestinal: Negative for nausea and vomiting.   All other systems reviewed and are negative.      Past Medical History:        Diagnosis Date   ??? Hyperlipidemia    ??? Seizure ([place])     as child         PastSurgical History:        Procedure Laterality Date   ??? CATARACT REMOVAL Left    ??? COLONOSCOPY     ??? OTHER SURGICAL HISTORY  10/30/2016    Injection triescence, PPV, ERMD, GFX and OS   ??? RETINAL LASER Left          Social History:  Social History     Socioeconomic History   ??? Marital status: Married     Spouse name: Not on file   ??? Number of children: Not on file   ??? Years of education: Not on file   ??? Highest education level: Not on file   Occupational History   ??? Not on file   Social Needs   ??? Financial resource strain: Not on file   ??? Food insecurity     Worry: Not on file     Inability: Not on file   ??? Transportation needs     Medical: Not on file     Non-medical: Not on file   Tobacco Use   ??? Smoking status: Never Smoker   ??? Smokeless tobacco: Never Used   Substance and Sexual Activity   ??? Alcohol use: Yes     Alcohol/week: 2.0 standard drinks     Types: 2 Cans of beer per week     Comment: daily   ??? Drug  use: No   ??? Sexual activity: Not on file   Lifestyle   ??? Physical activity     Days per week: Not on file     Minutes per session: Not on file   ??? Stress: Not on file   Relationships   ??? Social Product manager on phone: Not on file     Gets together: Not on file     Attends religious service: Not on file     Active member of club or organization: Not on file  Attends meetings of clubs or organizations: Not on file     Relationship status: Not on file   ??? Intimate partner violence     Fear of current or ex partner: Not on file     Emotionally abused: Not on file     Physically abused: Not on file     Forced sexual activity: Not on file   Other Topics Concern   ??? Not on file   Social History Narrative   ??? Not on file         Family History:    History reviewed. No pertinent family history.        Medications  Prior to Admission medications    Medication Sig Start Date End Date Taking? Authorizing Provider   tamsulosin (FLOMAX) 0.4 MG capsule Take 0.4 mg by mouth daily   Yes Historical Provider, MD   bimatoprost (LUMIGAN) 0.01 % SOLN ophthalmic drops Place 1 drop into both eyes nightly   Yes Historical Provider, MD   atorvastatin (LIPITOR) 10 MG tablet Take 10 mg by mouth daily   Yes Historical Provider, MD   Timolol (TIMOPTIC) 0.5 % (DAILY) SOLN ophthalmic solution 1 drop daily   Yes Historical Provider, MD        Vitals:  Temp 97.8 ??F (36.6 ??C)    Ht 6' (1.829 m)    Wt 209 lb (94.8 kg)    BMI 28.35 kg/m??       Physical Exam    General Appearance: alert and oriented to person, place and time, well developed and well- nourished, in no acute distress  Skin: warm and dry, no rash or erythema  Abdomen: soft, non-tender, non-distended, normal bowel sounds, no masses or organomegaly  IR:JJOACZYS Exam   Penis without lesion, rash, discharge,curve, or plaque  Scrotum skin without lesions.   Testicles no mass or tenderness. Epidiymis and vas are normal bilaterally  Good sphincter tone, no rectal masses.  Prostate is  soft flat non tender without nodules.       LABS:  No results found for: PSAFREE, PSAFREEPCT  No results for input(s): PSAFREE, PSAFREEPCT in the last 72 hours.  No results found for: TESTOSTERONE  No results found for: WBC, HGB, HCT, MCV, PLT  No results found for: NA, K, CL, CO2, BUN, CREATININE, GLUCOSE, CALCIUM   No results found for: LABURIN    No results found for this visit on 10/04/18.    Radiology Review:           IMPRESSION:    1. Elevated PSA  - PSA, Prostatic Specific Antigen; Future        PLAN:  Return in about 6 months (around 04/06/2019) for psa prior.       Brita Romp, MD   10/04/18   4:07 PM

## 2019-10-03 ENCOUNTER — Encounter: Attending: Urology

## 2020-02-01 ENCOUNTER — Telehealth

## 2020-02-01 MED ORDER — FLEET ENEMA 7-19 GM/118ML RE ENEM
7-19 GM/118ML | Freq: Once | RECTAL | 0 refills | Status: AC | PRN
Start: 2020-02-01 — End: 2020-02-01

## 2020-02-01 MED ORDER — LEVOFLOXACIN 750 MG PO TABS
750 MG | ORAL_TABLET | ORAL | 0 refills | Status: DC
Start: 2020-02-01 — End: 2020-03-01

## 2020-02-01 MED ORDER — DIAZEPAM 10 MG PO TABS
10 MG | ORAL_TABLET | Freq: Once | ORAL | 0 refills | Status: AC
Start: 2020-02-01 — End: 2020-02-01

## 2020-02-01 NOTE — Telephone Encounter (Signed)
psa from PCP is 7.9  I rec trus bx in the office  I see he has appt with app 11/30  He can keep that to further discuss bx  [facility] in [place]

## 2020-02-06 NOTE — Telephone Encounter (Signed)
Patient called and advised of recommendations from Dr. Myra Gianotti regarding elevated PSA level. I advised the patient that the NP Tappi will discuss with him tomorrow the results and TRUS BX. Pt verbalized understanding. Reviewed d/t/l of appointment for tomorrow, pt states he will be there.

## 2020-02-07 ENCOUNTER — Ambulatory Visit: Admit: 2020-02-07 | Discharge: 2020-02-07 | Payer: MEDICARE | Attending: Acute Care

## 2020-02-07 DIAGNOSIS — R972 Elevated prostate specific antigen [PSA]: Secondary | ICD-10-CM

## 2020-02-07 NOTE — Patient Instructions (Signed)
Trans-rectal Ultrasound Prostate Biopsy  Post-Procedure Instructions    You have been scheduled for a prostate biopsy in the office. You may be scheduled for this test based on an elevated PSA level, abnormal rectal exam, or to monitor a known prostate cancer.     The prostate is a gland that is typically the size of a small walnut, located beneath the bladder and in front of the rectum in men. It functions to produce the fluid part of semen and acts to control urination.         A prostate biopsy is performed with the patient positioned on their side, with their knees bent. The ultrasound probe is lubricated and inserted in to the rectum. A needle is then introduced into the prostate and extracts a small sample of tissue. The ultrasound helps the physician properly place this needle to ensure even and thorough sampling of your prostate.     On average, 12 biopsy samples are taken. The biopsy tissue samples are then sent to the pathology lab, where a pathologist will evaluate the tissue for the presence of prostate cancer.     It takes approximately 7-10 days to get biopsy results. You will be scheduled for follow-up with the doctor 1-2 weeks after the biopsy to discuss your results    Before the biopsy:  - Please stop any blood thinners (Aspirin, Coumadin, Plavix, etc.) 1 week prior to the biopsy.  - You must have someone with you to drive you home after procedure. You may not use public transportation, UBER or LYFT since you will have taken a Valium for procedure.  - You may eat and drink normally on the morning of your procedure.   - Please complete a Fleets Enema on the morning of your biopsy.  - You will be given a prescription for an antibiotic.   - Please take antibiotic 2 hours prior to your procedure.  - You will need to arrive about 15 minutes prior to procedure to receive an additional antibiotic the day of procedure. This will be administered to you by our nursing staff.   - If you are given prescription  for valium, please take 30 minutes prior to procedure.   - Take Ibuprofen 200mg  (1 tablet) 2 hours prior to procedure.  Take Extra Strength Tylenol (1 tablet) 2 hours prior to procedure.    After the procedure:   - Finish all antibiotics as prescribed.   - You may resume a normal diet and fluid intake immediately.  - You may return to work the next day, but avoid strenuous activity or heavy lifting for about 48 hours after the procedure.  - Some blood in the urine or stool can be normal, this should resolve within about 3-5 days.  - Expect some blood in the semen for up to a month after the biopsy. The semen will initially appear bright red and then become a rusty brown- this is normal.   - You may restart blood thinners 48 hours after the biopsy.     When to call the doctor:  - If you have a fever over 101 degrees and/or shaking chills (report to the nearest emergency department).  - Persistent blood in the urine or rectum, or passing large blood clots  - If you are unable to urinate or feel that you are not emptying your bladder.    Please call our office at [phone] if you have any question or concerns

## 2020-02-07 NOTE — Progress Notes (Signed)
Maurice Baker Maurice Kay, APRN - CNP       02/07/2020 at 5:14 PM    Urology Office Visit    PATIENT NAME: Maurice Baker   DATE OF BIRTH: [birth year]     TODAY'S DATE: 02/07/2020    CHIEF COMPLAINT:  Chief Complaint   Patient presents with   ??? Elevated PSA     PSA done.      Subjective:   Mr. Corpus is a 74 y.o. male who presents to the office for elevated PSA.     Last PSA: No results found for: PSA, PSADIA    He has no personal history and no family history of prostate cancer.     He has no prior genitourinary history of hematuria, prostatitis, UTI, erectile dysfunction, urolithiasis, previous GU surgery.    Voids q 3 hrs. Nocturia x 1.   Urgency: Denies  UUI: Denies  Dysuria: Denies  Hematuria: Denies  Stream is fair.     He is known to Dr. Myra Gianotti  10/04/2018  psa 09/06/18--5.8  He was last seen 12/2016 his psa then was 6.3  He was started on flomax 09/09/18 and stream weak but it is better. No nocturia.  No fhx pca    He is currently using Flomax for BPH with urinary symptoms.    His last PSA 7.9     ROS:   no dysuria, trouble voiding, or hematuria  negative for - erectile dysfunction, hematuria, incontinence, pelvic pain, scrotal mass/pain or urinary frequency/urgency    Medications  Prior to Admission medications    Medication Sig Start Date End Date Taking? Authorizing Provider   penicillin v potassium (VEETID) 500 MG tablet  02/01/20  Yes Historical Provider, MD   tamsulosin (FLOMAX) 0.4 MG capsule Take 0.4 mg by mouth daily   Yes Historical Provider, MD   bimatoprost (LUMIGAN) 0.01 % SOLN ophthalmic drops Place 1 drop into both eyes nightly   Yes Historical Provider, MD   atorvastatin (LIPITOR) 10 MG tablet Take 10 mg by mouth daily   Yes Historical Provider, MD   Timolol (TIMOPTIC) 0.5 % (DAILY) SOLN ophthalmic solution 1 drop daily   Yes Historical Provider, MD   levoFLOXacin (LEVAQUIN) 750 MG tablet levaquin 750mg  take one tablet one hour prior to prostate biopsy  Patient not taking: Reported on 02/07/2020  02/01/20   Brita Romp, MD      Vitals:  Temp 98 ??F (36.7 ??C)    Ht 6' (1.829 m)    Wt 209 lb (94.8 kg)    BMI 28.35 kg/m??     Physical Exam  Vitals reviewed.   Constitutional:       General: He is not in acute distress.     Appearance: He is well-developed.   Cardiovascular:      Pulses: Normal pulses.   Pulmonary:      Effort: Pulmonary effort is normal.   Abdominal:      Tenderness: There is no right CVA tenderness or left CVA tenderness.   Musculoskeletal:         General: Normal range of motion.   Neurological:      Mental Status: He is alert and oriented to person, place, and time.   Psychiatric:         Mood and Affect: Mood normal.       Labs:  No results found for this visit on 02/07/20.    No results found for: BMP, CBC, HGB, HCT, PSA, LABURIN, TESTOSTERONE  Impression/Plan  1. Elevated PSA    74 year old male presents to the office for elevated PSA. His last PSA was 7.9.     Shared decision making performed today regarding PSA screening in addition to age and ethnicity adjusted total PSA.    I reiterated that PSA blood testing is an imperfect screening tool but when used appropriately can help detect prostate cancer.    I explained the best way to interpret PSA is to establish a trend and follow the kinetics of PSA.     I explained both DRE and PSA help determine when a prostate biopsy is warranted.    Today the prostate biopsy is indicated. I discussed the technique in detail. The transrectal ultrasound, pudendal block, and the biopsy proccess (at least 12 cores). We discussed the implications of the pathology and the potential risk for false negative biopsy and the need for ongoing psa evaluation. I estimate 7-10 days to get the pathology report back and that it may need to be sent for further testing or to other institutions at the Pathologists discretion. I explained the pre procedure preparation and the need to start antibiotics prior to arrival for procedure, intramuscular injection of  antibiotic on arrival for procedure, and perform an enema. Post procedure risks include bleeding (hematuria, hematospermia, and hematochezia), infection possibly leading to sepsis, and pain.  Reviewed procedure with use of visual aids.  All questions were answered to patient satisfaction during visit.  He verbalized understanding and wishes to proceed.    Advised that he will need to hold any blood thinning medications prior to procedure.  He denies any current anticoagulation or antiplatelet therapy.    Advised patient that he will receive advised him to take prior to procedure and because of this he will require a driver to and from procedure.    Rx sent to patient's pharmacy for Valium, Levaquin, and fleets enema.  Advised patient to pick up prescription and hold on until appointment date.    Advised patient due to the intramuscular injection for second antibiotic coverage, he will need to arrive at appointment 45 minutes before procedure time.    He will receive gentamicin 160 mg (intramuscular injection) on arrival for transrectal ultrasound prostate biopsy.    Medications have been sent to the pharmacy on file by Dr. Myra Gianotti on 02/01/2020.    The patient was instructed to call the office or go to the nearest ER if worsening symptoms such as fever > 101F, inability to urinate, intractable nausea or vomiting, or uncontrolled pain. The patient verbalizes understanding.      Plan to follow up for Prostate biopsy.     Follow Up:  Return if symptoms worsen or fail to improve.    --Letitia Libra, CNP, APRN  on 02/07/2020 at 5:14 PM    An electronic signature was used to authenticate this note.

## 2020-02-21 ENCOUNTER — Ambulatory Visit: Admit: 2020-02-21 | Discharge: 2020-02-21 | Payer: MEDICARE | Attending: Urology

## 2020-02-21 DIAGNOSIS — R972 Elevated prostate specific antigen [PSA]: Secondary | ICD-10-CM

## 2020-02-21 MED ORDER — GENTAMICIN SULFATE 40 MG/ML IJ SOLN
40 MG/ML | Freq: Once | INTRAMUSCULAR | Status: AC
Start: 2020-02-21 — End: 2020-02-21
  Administered 2020-02-21: 13:00:00 160 mg via INTRAMUSCULAR

## 2020-02-21 NOTE — Progress Notes (Signed)
Patient presents for his  TRUS biopsy.  Informed consent has been obtained.  Patient did take his antibiotic.  Patient also took Valium.      Gentamycin 120mg  IM   Given 30 Minutes prior.     PSA  7.9     Patient in the right lateral decubitus position.  Rectal exam performed.  The ultrasound probe was placed in the rectum.  Prostate scanned in 2 planes.  Then with guidance lidocaine was injected right and left side at the base) at "Beak Sign"          Then ultrasound of the prostate performed.  Volume obtained. 49cc    Then the ultrasound guidance guided biopsies were performed.  A total of 12 in the standard fashion.  Patient tolerated procedure well and discharged home.    Re-discussed expectations post prostate biopsy.     Patient also reinstructed that he should immediately go to [facility] emergency department if he develops shaking chills or a temperature of 100.4 or greater.    Follow up  based on Bx results    If Positive, follow-up with Dr. Perfecto Kingdom or [facility] for robotic prostatectomy conference and discussion of prostate cancer.  If negative follow-up in 6 months with PSA prior

## 2020-02-21 NOTE — Progress Notes (Signed)
XYLOCAINE 2% 10cc  NDC: [id number]  LOT #: [id number]  EXP: 03/10/2021  ADMINISTERED BY: Ovid Curd, MD  PATIENT TOLERATED WELL

## 2020-02-21 NOTE — Addendum Note (Signed)
Addended by: Maebelle Munroe on: 02/21/2020 09:30 AM     Modules accepted: Orders

## 2020-02-23 ENCOUNTER — Telehealth

## 2020-02-23 LAB — SURGICAL PATHOLOGY

## 2020-02-23 NOTE — Telephone Encounter (Signed)
Seen initially by Dr. Myra Gianotti in 2018    Seen by T. Thomas on 02/07/2020    Prostate biopsy was performed by Dr. Modena Morrow on 02/21/2020    Prostate biopsy has come back showing cancer    Please contact the patient and discussed this with him.    Please make sure that the patient did not have any bad aftereffects from the biopsy    The patient should be scheduled with Dr. Perfecto Kingdom or Dr. Rory Percy through the [place] for further discussions regarding the options for management of this cancer (please forward this Call to Sarah after he had discussed the matter with the patient.)    Thank you

## 2020-02-24 NOTE — Telephone Encounter (Signed)
Spoke to both the patient and his wife Horris Latino. He did not have any bad after effects from the biopsy. We reviewed the surgical pathology and I reiterated that the prostate biopsy has come back showing cancer. 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 I explained that he is to be scheduled with Dr. Perfecto Kingdom or Dr. Rory Percy for further discussions regarding the options for management of this cancer.    Cecille Rubin please schedule his in a conference spot with either Dr. Perfecto Kingdom or Dr. Rory Percy ASAP thanks

## 2020-02-27 NOTE — Telephone Encounter (Signed)
Talked to Maurice Baker his appt is 12.23.2021 @7 :00. Lt

## 2020-03-01 ENCOUNTER — Ambulatory Visit: Admit: 2020-03-01 | Discharge: 2020-03-01 | Payer: MEDICARE | Attending: Urology

## 2020-03-01 DIAGNOSIS — C61 Malignant neoplasm of prostate: Secondary | ICD-10-CM

## 2020-03-01 NOTE — Progress Notes (Signed)
Maurice Caraway, MD        03/01/2020 at 8:30 AM  Office follow up          PATIENT NAME: Maurice Baker   DATE OF BIRTH: 09-Jul-1945       TODAY'S DATE: 03/01/2020    CHIEF COMPLAINT:  Chief Complaint   Patient presents with   . Other     Conference        Subjective:   Mr. Ramsburg is a 74 y.o. male who presents to the office for follow up of prostate cancer    He does come in today with his wife for prostate cancer conference      He has been diagnosed with low risk prostate cancer on TRUS biopsy      Review of Systems   Constitutional: Negative for activity change, diaphoresis and fever.   Genitourinary: Negative for difficulty urinating, dysuria, frequency, hematuria, testicular pain and urgency.       Past Medical History:        Diagnosis Date   . Hyperlipidemia    . Seizure [facility])     as child       Past Surgical History:        Procedure Laterality Date   . CATARACT REMOVAL Left    . COLONOSCOPY     . OTHER SURGICAL HISTORY  10/30/2016    Injection triescence, PPV, ERMD, GFX and OS   . RETINAL LASER Left          Allergies:  Patient has no known allergies.    Social History:  Social History     Socioeconomic History   . Marital status: Married     Spouse name: Not on file   . Number of children: Not on file   . Years of education: Not on file   . Highest education level: Not on file   Occupational History   . Not on file   Tobacco Use   . Smoking status: Never Smoker   . Smokeless tobacco: Never Used   Substance and Sexual Activity   . Alcohol use: Yes     Alcohol/week: 2.0 standard drinks     Types: 2 Cans of beer per week     Comment: daily   . Drug use: No   . Sexual activity: Not on file   Other Topics Concern   . Not on file   Social History Narrative   . Not on file     Social Determinants of Health     Financial Resource Strain:    . Difficulty of Paying Living Expenses: Not on file   Food Insecurity:    . Worried About Programme researcher, broadcasting/film/video in the Last Year: Not on file   . Ran Out of Food in the Last  Year: Not on file   Transportation Needs:    . Lack of Transportation (Medical): Not on file   . Lack of Transportation (Non-Medical): Not on file   Physical Activity:    . Days of Exercise per Week: Not on file   . Minutes of Exercise per Session: Not on file   Stress:    . Feeling of Stress : Not on file   Social Connections:    . Frequency of Communication with Friends and Family: Not on file   . Frequency of Social Gatherings with Friends and Family: Not on file   . Attends Religious Services: Not on file   . Active Member of  Clubs or Organizations: Not on file   . Attends Archivist Meetings: Not on file   . Marital Status: Not on file   Intimate Partner Violence:    . Fear of Current or Ex-Partner: Not on file   . Emotionally Abused: Not on file   . Physically Abused: Not on file   . Sexually Abused: Not on file   Housing Stability:    . Unable to Pay for Housing in the Last Year: Not on file   . Number of Places Lived in the Last Year: Not on file   . Unstable Housing in the Last Year: Not on file       Family History:        Medications  Prior to Admission medications    Medication Sig Start Date End Date Taking? Authorizing Provider   penicillin v potassium (VEETID) 500 MG tablet  02/01/20  Yes Historical Provider, MD   tamsulosin (FLOMAX) 0.4 MG capsule Take 0.4 mg by mouth daily   Yes Historical Provider, MD   bimatoprost (LUMIGAN) 0.01 % SOLN ophthalmic drops Place 1 drop into both eyes nightly   Yes Historical Provider, MD   atorvastatin (LIPITOR) 10 MG tablet Take 10 mg by mouth daily   Yes Historical Provider, MD   Timolol (TIMOPTIC) 0.5 % (DAILY) SOLN ophthalmic solution 1 drop daily   Yes Historical Provider, MD        Vitals:  There were no vitals taken for this visit.    Physical Exam  General: alert, appears stated age and cooperative   Abdomen: soft and nondistended    Back: straight, CVA tenderness absent   GU: defer exam       Labs:  WBC No results found for: WBC  BMP No results  found for: NA, K, CL, CO2, BUN, CREATININE, GLUCOSE, CALCIUM   PSA No results found for: PSA, PSADIA  UANo results found for: VOL, APPEARANCE, COLORU, LABSPEC, LABPH, LEUKBLD, NITRU, GLUCOSEU, KETUA, UROBILINOGEN, KETUA, UROBILINOGEN, BILIRUBINUR, OCBU      Review:   DIAGNOSIS:     A. PROSTATE, RIGHT BASE, NEEDLE CORE BIOPSY - PROSTATIC ACINAR   ADENOCARCINOMA, GLEASON SCORE 3+3=6 (GRADE GROUP 1) INVOLVING 20% OF   THE SPECIMEN.       B. PROSTATE, RIGHT MID, NEEDLE CORE BIOPSY - PROSTATIC ACINAR   ADENOCARCINOMA, GLEASON SCORE 3+3=6 (GRADE GROUP 1) INVOLVING <5 % OF   THE SPECIMEN       C. Prostate, right apex, needle core biopsy - Benign prostatic tissue       D. PROSTATE, RIGHT LATERAL BASE, NEEDLE CORE BIOPSY - PROSTATIC ACINAR   ADENOCARCINOMA, GLEASON SCORE 3+3=6 (GRADE GROUP 1) INVOLVING 40% OF   THE SPECIMEN.       E. PROSTATE, RIGHT LATERAL MID, NEEDLE CORE BIOPSY - ATYPICAL SMALL   ACINAR PROLIFERATION       F. Prostate, right lateral apex, needle core biopsy - Benign prostatic   tissue       G. PROSTATE, LEFT BASE, NEEDLE CORE BIOPSY - PROSTATIC ACINAR   ADENOCARCINOMA, GLEASON SCORE 3+3=6 (GRADE GROUP 1) INVOLVING 15% OF   THE SPECIMEN       H. PROSTATE, LEFT MID, NEEDLE CORE BIOPSY - ATYPICAL SMALL ACINAR   PROLIFERATION WHICH       I. Prostate, left apex, needle core biopsy - Benign prostatic tissue       J. PROSTATE, LEFT LATERAL BASE, NEEDLE CORE BIOPSY - PROSTATIC ACINAR   ADENOCARCINOMA, GLEASON  SCORE 3+3=6 (GRADE GROUP 1) INVOLVING 20% OF   THE SPECIMEN       K. PROSTATE, LEFT LATERAL MID, NEEDLE CORE BIOPSY - PROSTATIC ACINAR   ADENOCARCINOMA, GLEASON SCORE 3+3=6 (GRADE GROUP 1) INVOLVING 5 % OF   THE SPECIMEN       L. Prostate, left lateral apex, needle core biopsy - Benign prostatic   tissue         Today, we discussed the Gleason score, number and percent of cores involved with disease, the [place] risk stratification criteria which are based upon the Gleason score, PSA, and  clinical T stage. Based upon the clinical features the patient falls into low risk category for 10 year biochemical disease free survival regardless of which therapeutic modality he chooses.     He understands that there is a small, but real, chance of occult metastatic disease and therefore, we discussed the role of a Gadolinium MRI of the pelvis to potentially assess the local extent of his disease as well as a total body bone scan. These modalities have a low yield in his particular clinical situation.    We discussed options for management of his disease including watchful waiting, active surveillance, [facility] analogue therapy and surgical extirpation including open, laparoscopic, and robotic.     We also discussed radiation therapy; external beam, and out patient brachytherapy as well as the attendant procedure related morbidity as it relates to the low but known risk of long term urinary frequency, urgency, urgency with bowel movements and risk of urethral radiation injury. We also discussed time commitment for the external beam therapy being once a day, five days a week for 5-7 weeks during which most people can continue normal daily activities.     I also mentioned the experimental Cyber Knife therapy. We discussed the time frame for PSA response that would indicate efficacy of the radiation therapy.    Additionally, we discussed cryosurgical ablation of the prostate (with without nerve sparing). We also discussed the outpatient nature of the procedure, lack of need for pain medication and the need for a urinary catheter for 2-weeks following the procedure and the time frame of PSA response that would reflect the efficacy of the cryosurgical procedure and the follow up that would be involved. Finally, with respect to the cryosurgical procedure we discussed that to date we do not have long term (10-year) outcomes data.    With respect to surgical intervention we discussed the procedure related morbidity,  hospitalization, and convalescence period. We have reviewed the recent data about robotic prostatectomy. I told him that I am fellowship trained in robotic urologic surgery and urologic oncology.    I explained that I perform Robotic Prostatectomy by [facility] Prostatectomy Technique.     I explained the procedure and the expected post op course.    The following is the summary of the information I gave him about Post-op recovery and     Complications:    1 percent of robotic prostatectomy patients are able to go home the day after surgery. The major advantages of the robotic prostatectomy are decreased bleeding and decreased pain, which result in easier recuperation. Thus, most individuals undergoing this operation will be able to resume normal activities within one to two weeks after surgery. However, each individual heals at his own rate. You should listen to your body, and do what it tells you do to. You will be given two weeks' disability after the robotic prostatectomy. Long-term disability is not necessary  after this procedure since complications are rare. Any further time off will be given only if medically necessary. Additional disability will be determined on a case-by-case basis.    Less than 1% of the patients have complication requiring any additional procedure like control of bleeding, drainage of collection, repair of intestine or hernia.     Discharge from Hospital  If your surgery goes without complications, as is true in more than 98 percent of our cases, you will be discharged from the hospital in 24 hours.     Urinary Catheter    When the prostate is removed, the bladder is disconnected from the urethra or urinary channel. After surgery, the bladder is reconnected to the urethra with fine sutures. A tube or catheter is inserted for seven to 14 days as you heal.     Rarely (in 7 out of 1,000 cases), the catheter may need to be reinserted for a few a days.    Urinary  Control    Our most recent results show that 50 percent of men have full urinary control and do not need pads within 24 hours after catheter removal. The rest of our patients may take up to four weeks to get full urinary control. A few patients will leak urine during heavy physical activity. Less than 2% patients require additional procedure like sling or artificial urinary sphincter at the end on 1 year.    Sexual Function    Even when the nerves are preserved, it takes time for them to heal and function. We have found that it takes most men 9-12 months before normal erections return although many men have partial erections before that. With conventional surgery, if you are below 52 years of age and have normal sexual function (Sexual Health Inventory for Men Score of 25, no medications), there is a 75 to 80 percent chance you will be able to resume sexual intercourse within 12 months. However, the quality of your erections may not be the same. With a robotic prostatectomy, one-third of patients that fit this criteria resume sexual intercourse within 12-24 weeks after surgery.     You have a have aggressive cancer, we will attempt to preserve the major erectile nerves, but in order to give you a good cancer operation we may have to take one or both of your nerves, this may cause a significant erectile dysfunction.    A nerve-sparing operation is not 100 percent successful in recovery of Sexual Function, Unfortunately, There are emotional, psychological and physical components to sexual functioning and all of them are affected by the diagnosis and treatment of cancer. Whether you have hormones, radiation or surgery, there is often a change in sexual function. There are three physical factors that are involved in penile erection - the nerves, the blood vessels and the muscle. With any of the treatments all three components are affected. Preserving the nerves saves one, perhaps the most important component of the  three, but not all three.     In order to improve your chances of maintaining erectile function, We offer an inter-disciplinary clinic that will work with you and your partner, before and after surgery, if you are interested. We will explore and attempt to preserve the emotional as well as the physical components (nerves, blood vessels and muscle).     Permanent Effect    All patients undergoing radical prostatectomy will be rendered sterile (i.e., will not be able to father children) after the procedure.     Cancer  Cure     Conventional prostatectomy results in very good cancer cure rates. However, the completeness of cancer removal may be higher for some patients who have the robotic prostatectomy.     Patient verbalized the understanding of above.    On this date, 03/01/20 , I have spent 45 minutes reviewing previous notes, test results and face to face with the patient discussing the diagnosis and importance of compliance with the treatment plan as well as documenting on the day of the visit      Impression/Plan  1. Prostate cancer (HCC)  - [facility] - [facility]    2. Elevated PSA         He is deciding on options    He is considering brachytherapy if this could be scheduled quickly, as they are [facility] residents and usually would have been in [place] by now    Alternatively he is considering active surveillance over the next 5 months until returning to [place], the with the potential of brachytherapy vs EBRT      Referral placed to radiation oncology    Maurice Caraway, MD   03/01/20   8:30 AM

## 2020-03-07 ENCOUNTER — Encounter

## 2020-03-07 NOTE — Other (Unsigned)
Patient Acct Nbr: [id number]   Primary AUTH/CERT:   [place] Name: [place] name: [facility]  Primary Insurance Group Number: [id number]  Primary Insurance Plan Type: Health  Primary Insurance Policy Number: [id number]

## 2020-03-07 NOTE — Consults (Signed)
[place]  Radiation Oncology  Phone: [phone]    RADIATION ONCOLOGY INITIAL CONSULTATION    PATIENT: Maurice Baker, Maurice Baker  DATE OF SERVICE: 03/07/2020  [facility] MRN: [id number]  [facility] MRN:  ACCOUNT #: [id number]  DOB: [birth year]  AGE: 74    PRIMARY SITE: Malignant neoplasm of prostate, Grade Group 1 on TRUS  biopsy BY Dr. Lorie Phenix on 02/21/2020, volume determined to be 49  cc with twelve cores biopsied with the following positive core  percentages (all 3+3):  1. Right base-20%  2. Right mid-<5%  3. Right lateral-40%  4. Left base-15%  5. Left lateral base-20%  6. Left lateral mid-5%      STAGE: I (T1c N0 M0); PSA <10    HISTORY OF PRESENT ILLNESS: Maurice Baker is a 74 year old male who  according to his wife has had a long-term bounce pattern in his PSA  but the recent rise of PSA to 7.9. Previous PSAs were:  12/2016-6.3  09/06/18-5.8  He met with Dr. Lyn Hollingshead in an extended office consultation on  03/01/2020 with a discussion of watchful waiting, active surveillance,  [facility] analogue therapy and surgical extirpation including open,  laparoscopic, and robotic.  Dr. Perfecto Kingdom also discussed radiation therapy; external beam, and  outpatient brachytherapy as well as the attendant procedure related  morbidity as it relates to the low but known risk of long-term urinary  frequency, urgency, urgency with bowel movements and risk of urethral  radiation injury. Dr. Perfecto Kingdom also discussed time commitment for the  external beam therapy being once a day, five days a week for 5-7 weeks  during which most people can continue normal daily activities.  Dr. Perfecto Kingdom also mentioned the experimental Cyber Knife therapy,  discussing the timeframe for PSA response that would indicate efficacy  of the radiation therapy.  Additionally, Dr. Perfecto Kingdom discussed cryosurgical ablation of the  prostate (with without nerve sparing). We also discussed the  outpatient nature of the procedure, lack of need for  pain medication  and the need for a urinary catheter for 2-weeks following the  procedure and the timeframe of PSA response that would reflect the  efficacy of the cryosurgical procedure and the follow up that would be  involved. Finally, with respect to the cryosurgical procedure we  discussed that to date we do not have long-term (10-year) outcomes  data. With respect to surgical intervention, Dr. Perfecto Kingdom discussed the  procedure related morbidity, hospitalization, and convalescence  period. We have reviewed the recent data about robotic prostatectomy.    He is deciding on options; considering brachytherapy if this could be  scheduled quickly, as they are [place] residents during the winter  ([place], [place]) and usually would be in [place] by now.  Alternatively, he is considering active surveillance over the next 5  months until returning to [place], with the potential of brachytherapy  (BT) versus external beam radiotherapy (EBRT). He was referred to our  department for discussion of radiation therapy treatment options.    PAST MEDICAL HISTORY:  1. 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  2. Hyperlipidemia  3. Seizure history as a child  4. 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    PAST SURGICAL HISTORY:  Underwent injection triescence, PPV, ERMD, GFX and OS 10/20/2016  Retinal laser surgery on left eye  Cataract removal of his left eye    SOCIAL HISTORY: Married for 50 years. 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 He and his wife spend the winters in [place], [place]  and return to [place] in April of each year, they have been married for  50 years. He is a non-smoker; plays golf twice a  week.  Drinks two beers daily.  Has a POA.    FAMILY HISTORY: Mother had myeloma    ALLERGIES: No Known Drug Allergies    MEDICATIONS:  1. Flomax - 1 Capsule Oral Daily  2. Lipitor - 1 Tablet Oral Daily  3. Lumigan -  Ophthalmic Daily  4. timolol -  Ophthalmic Daily  Medications Last Reconciled by Maurice Hawking MD on 03/07/2020    SUMMARY OF SIGNIFICIANT X-RAY/LABORATORY FINDINGS: See PSA levels  listed under history of present illness.      REVIEW OF SYSTEMS:  Pain: 0. - No pain  Constitutional: Good appetite. Good energy level. No fever, chills,  sweats, or headaches.  Vision: Wears glasses; legally blind in right eye, 20/400. Left  cataracts removed.  Ears Nose Throat and Mouth: No hearing loss. No mouth, throat, or  swallowing issues.  Respiratory: No shortness of breath at rest. No dyspnea on exertion.  No cough. No paroxysmal nocturnal dyspnea or orthopnea. Not on  oxygen.  Cardiovascular: No chest pain. No heart racing. No palpitations.  Gastrointestinal: No nausea, vomiting, diarrhea, or constipation. No  reflux.  Genitourinary: No dysuria, hematuria. On conversation today he states  that he does not have to wake up at night to urinate; does have weak  stream, currently on Flomax for reported BPH. Voids every 3 hours, no  hesitancy, does not have start and stop urinary process. No history of  prostatitis, UTI, erectile dysfunction (noted moderate erections). No  scrotal mass.  Musculoskeletal: No arthritic symptoms. No swelling. Good range of  motion.  Skin: No rashes, open areas, or pruritus.  Neurological: No numbness, tingling, or weakness.  Psychiatric: No anxiety or depression.  Hematology/Lymphatic: Blood counts okay. No anemia. No blood  transfusion.  Allergy/Immunologic: No connective tissue disease, such as rheumatoid  arthritis, scleroderma, or lupus.  Endocrine: No thyroid or diabetic issues.  ECOG Performance Status: 0    PHYSICAL EXAMINATION: Nurse chaperone during rectal  examination  Maurice Hawking, RN).  VITALS: Temperature 98 F (03/07/20), Pulse 66 (03/07/20), Respirations  18 (03/07/20), Blood Pressure 150/94 (03/07/20), Height 72 inches  (03/07/20) Weight 215.8 pounds 03/07/20  GENERAL: Awake, alert, oriented x3, no anxiety, dressed appropriately,  appears of stated age. Ambulates without assistance. Speech pattern  fluent.  HEAD AND NECK: COVID 19 mask in place. Nonicteric sclera.  LUNGS: Clear to auscultation. No rales or rhonchi.  HEART: Regular rate and rhythm, S1-S2 noted no murmur.  NECK: Symmetric. No thyroid nodule.  NODES: No neck, supraclavicular, infraclavicular, or axillary  adenopathy.  ABDOMEN: Soft, nontender, nondistended. No hepatosplenomegaly, no  suspicious mass.  PROSTATE: Normal sphincter tone. Prostate enlarged without distinct  nodularity. Rectal mucosa negative up to approximately 8 cm level.  MUSCULOSKELETAL: No swelling. No calf tenderness. Motor strength 5/5  in upper and lower extremities with sensation intact to light touch.  No spine or  posterior chest wall tenderness. No dullness to  percussion.  SKIN: Without excessive bruising. No open areas or rash.  NEURO: PERRLA, EOMI. CN 2-12 grossly intact except full vision fields  were not tested (known history of low vision in right eye).    IMAGING STUDIES: No MRI of pelvis as baseline. No CT of pelvis as  baseline.    IMPRESSION: Maurice Baker is a 74 year old gentleman who presents with    of his twelve prostate biopsy cores positive, low volume, grade 1 with  BPH but no current nocturia here for discussion of his radiation  therapy options as he had an extended discussion with Dr. Perfecto Kingdom  about watchful waiting and surgical options in detail as well as  radiation therapy options.    PLAN: The consultation after full physical examination including  rectal examination, question of his pathology, PSA.  Discussed  external beam radiation therapy with modified hypofractionated  radiation therapy course  of 70 Gy in 2.5 Gy in 28 fractions using IMRT  with hydrogel spacer in place.  Using a Lucite model showed him where  the spacer would be placement in the rectal prostatic fossa to reduce  rectal dose to the anterior rectal wall.  Discussed that the treatment  is given every weekday, and after placement of the hydrogel spacer, he  would undergo CT-simulation within a week or less and the planning  process, which will include contouring, dose definition, and QA, would  be up to 2 weeks before he actually started the external beam  radiation therapy in our department.  Complications of rectal  irritation, bladder urgency, dysuria, nocturia, and fatigue all  discussed.  Discussion then turned to the implant process, which would be  iodine-125 seeds placed in his prostate under general anesthesia after  a prostate volume study, which was performed approximately 2 to 3  weeks prior to the implant procedure proper.  This will require an  outpatient TRUS examination with a prostate volume study to determine  the actual seed ordering process and placement into the prostate gland  itself.  The seed implant itself, will be performed with the urology  team on a Tuesday morning.  Discussed with him again that he can have this procedure done after he  and his wife return from their winter vacation home in [place] in  April sometime, all appointments will be arranged with urology and  radiation oncology in a timely manner.  Told him that waiting until  spring would not be a detriment to him and with the COVID-19 situation  may be easier to schedule at that time, as this is an elective  procedure, though it is a cancer procedure.  Area aspect of the EBRT and BT eluding all quality assurance  procedures were discussed in detail.  All questions were answered to  his satisfaction and to the satisfaction of his wife and a left over  good understanding of both processes and decided on a prostate implant  procedure, which he signed a  consent for in order to start with a  prostate volume study.  The complications of possibility of short-term  catheterization after the procedure, increased urinary symptoms,  fatigue, possibility of operatively removing stray seeds on a strand  from the bladder and urethra are also possibilities.  The consultation was for 2 hours.    We thank you for the consultation.      Doris Cheadle, MD    Electronically signed by: Gaspar Bidding, MD  D:  03/07/2020, T: 9:21 PM    CC:  Louie Casa, MD, Lyn Hollingshead, MD, Dorthey Sawyer. Myra Gianotti, MD,  Ovid Curd, MD    The [facility] Department of Radiation Oncology is  an [place] of Radiology (ACR).    This document was completed utilizing speech recognition software.  Grammatical errors, random word insertions, pronoun errors, and  incomplete sentences are an occasional consequence of this system due  to software limitations, ambient noise, and hardware issues. Any  formal questions or concerns about the content, text or information  contained within the body of this dictation should be directly  addressed to the provider for clarification.    PATIENT:Gaspard Swindler  DOB: 1945-05-22        cc:  Purcell Nails L. Myra Gianotti, MD       [address]       [address]       [place] [address]         Louie Casa, MD       [address]       [place] [address]         Joshua B Nething, MD       [place] [address]         Kevin A. Modena Morrow, [place]       [place] [address]

## 2020-03-14 NOTE — Telephone Encounter (Signed)
From: [name]  To: Dr. Brita Romp  Sent: 03/14/2020 12:38 PM EST  Subject: MRI 03/21/2020    This appointment should have been cancelled per Dr Durward Parcel.   Please cancel

## 2020-03-15 NOTE — Telephone Encounter (Signed)
Patient wishes to have MRI of the pelvis performed in the Spring on his return from [place].

## 2020-03-17 NOTE — Telephone Encounter (Signed)
The patient has been added to the prostate MRI schedule for [place] to contact him in April to re-schedule the study    Juluis Mire, MD

## 2020-03-21 ENCOUNTER — Telehealth

## 2020-03-21 ENCOUNTER — Encounter

## 2020-03-21 NOTE — Telephone Encounter (Signed)
Please place surgery orders for brachytherapy seed implant.     Per Dr. Wilmon Arms office scheduling for June 15 when the patient will return from [place].

## 2020-03-21 NOTE — Telephone Encounter (Signed)
So we will be canceling the MRI , correct?      Please contact patient and schedule [facility]): Transrectal ultrasound and transperineal implantation of I-125 prostate brachytherapy    Dx: Prostate cancer        Epic "Prep for Proc" orders entered by me

## 2020-03-22 NOTE — Telephone Encounter (Signed)
Patient is scheduled for MRI in May per Dr. Durward Parcel. Patient is to keep this appointment.    Brachytherapy scheduled for 08/22/2020. Spoke with patient he is aware of the t/d/l. Per patient mailing information to [place] home.

## 2020-06-11 NOTE — Telephone Encounter (Signed)
Surgery r/s for 08/15/2020. Mailing out new booklet. Not able to reach patient by phone. Provider not available for original scheduled date.

## 2020-07-11 NOTE — Telephone Encounter (Signed)
Note from Dr. Durward Parcel to move patient to 09/19/2020. Patient r/s mailing out new booklet. Spoke with patient aware of new t/d/l.

## 2020-07-17 ENCOUNTER — Ambulatory Visit

## 2020-07-17 ENCOUNTER — Encounter

## 2020-07-17 DIAGNOSIS — C61 Malignant neoplasm of prostate: Secondary | ICD-10-CM

## 2020-08-03 ENCOUNTER — Ambulatory Visit

## 2020-08-08 ENCOUNTER — Encounter

## 2020-08-15 ENCOUNTER — Ambulatory Visit

## 2020-08-20 ENCOUNTER — Inpatient Hospital Stay: Admit: 2020-08-20

## 2020-08-20 DIAGNOSIS — C61 Malignant neoplasm of prostate: Secondary | ICD-10-CM

## 2020-08-20 MED ORDER — GADOBUTROL 1 MMOL/ML IV SOLN
1 MMOL/ML | Freq: Once | INTRAVENOUS | Status: AC | PRN
Start: 2020-08-20 — End: 2020-08-20
  Administered 2020-08-20: 15:00:00 9 mL via INTRAVENOUS

## 2020-08-20 NOTE — Other (Unsigned)
Patient Acct Nbr: [id number]   Primary AUTH/CERT: [id number]  [place] Name: [place] name: [facility]  Primary Insurance Group Number: [id number]  Primary Insurance Plan Type: Health  Primary Insurance Policy Number: [id number]

## 2020-08-21 NOTE — Other (Unsigned)
Patient Acct Nbr: [id number]   Primary AUTH/CERT:   [place] Name: [place] name: [facility]  Primary Insurance Group Number: [id number]  Primary Insurance Plan Type: Health  Primary Insurance Policy Number: [id number]

## 2020-08-22 ENCOUNTER — Encounter

## 2020-08-23 ENCOUNTER — Encounter

## 2020-08-23 ENCOUNTER — Telehealth

## 2020-08-23 NOTE — Progress Notes (Signed)
[facility]  Radiation Oncology  Phone: [phone]    RADIATION ONCOLOGY FOLLOW UP    PATIENT: Maurice Baker, Maurice Baker  DATE OF SERVICE: 08/23/2020  [facility] MRN: [id number]  [facility] MRN: [id number]  ACCOUNT #: [id number]  DOB: 1945/11/12  AGE: 75    PRIMARY SITE: Malignant neoplasm of prostate, Grade Group 1 on TRUS  biopsy BY Dr. Lorie Phenix on 02/21/2020, volume determined to be 49  cc with twelve cores biopsied with the following positive core  percentages (all 3+3):  1. Right base-20%  2. Right mid-<5%  3. Right lateral-40%  4. Left base-15%  5. Left lateral base-20%  6. Left lateral mid-5%  - Note MRI gland size was 57 cc on study of 08/20/2020.    STAGE: I (T1c N0 M0); PSA <10; upstaged to IIIB (T3a) due to MRI of  the pelvis findings from 08/20/2020.    HISTORY OF PRESENT ILLNESS: Maurice Baker is a 75 year old male who  according to his Baker has had a long-term bounce pattern in his PSA  but the recent rise of PSA to 7.9.  Initial consultation in the department of radiation therapy was on  03/07/2021.  Previous PSAs were:  12/2016-6.3  09/06/18-5.8  He met with Dr. Lyn Hollingshead in an extended office consultation on  03/01/2020 with a discussion of watchful waiting, active surveillance,  [facility] analogue therapy and surgical extirpation including open,  laparoscopic, and robotic.  Dr. Perfecto Kingdom also discussed radiation therapy; external beam, and  outpatient brachytherapy as well as the attendant procedure related  morbidity as it relates to the low but known risk of long-term urinary  frequency, urgency, urgency with bowel movements and risk of urethral  radiation injury. Dr. Perfecto Kingdom also discussed time commitment for the  external beam therapy being once a day, five days a week for 5-7 weeks  during which most people can continue normal daily activities.  Dr. Perfecto Kingdom also mentioned the experimental Cyber Knife therapy,  discussing the timeframe for PSA response that would indicate efficacy  of  the radiation therapy.  Additionally, Dr. Perfecto Kingdom discussed cryosurgical ablation of the  prostate (with without nerve sparing). We also discussed the  outpatient nature of the procedure, lack of need for pain medication  and the need for a urinary catheter for 2-weeks following the  procedure and the timeframe of PSA response that would reflect the  efficacy of the cryosurgical procedure and the follow up that would be  involved. Finally, with respect to the cryosurgical procedure we  discussed that to date we do not have long-term (10-year) outcomes  data. With respect to surgical intervention, Dr. Perfecto Kingdom discussed the  procedure related morbidity, hospitalization, and convalescence  period. We have reviewed the recent data about robotic prostatectomy.        INTERVAL HISTORY: He decided on  brachytherapy in December 2021, but  as he and his Baker are residents during the winter in [place],  [place], the MRI requested for fusion with the prostate volume study which  was scheduled in May 2022 was delayed until Maurice Baker and  the MRI was performed on 08/20/2020. The official read of the pelvic  MRI on the morning of 08/22/2020,  which was a day after his prostate  volume study in our department showed extracapsular extention. The MRI  of the pelvis requested for fusion of volumes in anticipation of a  prostate implant, demonstrated extracapsular extension (see SUMMARY OF  SIGNIFICIANT X-RAY/LABORATORY FINDINGS  below).    PAST MEDICAL HISTORY:  1. 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  2. Hyperlipidemia  3. Seizure history as a child  4. Back pain, due to many years of golf swing torque (has been playing  golf since he was about 75 year old his Baker noted).    PAST SURGICAL  HISTORY:  Underwent injection triescence, PPV, ERMD, GFX and OS 10/20/2016  Retinal laser surgery on left eye  Cataract removal of his left eye    ALLERGIES: No Known Drug Allergies    MEDICATIONS:  1. Flomax - 1 Capsule Oral Daily  2. Lipitor - 1 Tablet Oral Daily  3. Lumigan -  Ophthalmic Daily  4. timolol -  Ophthalmic Daily  Medications Last Reconciled by Gaspar Bidding MD on 08/24/2020    PSA pending, unfortunately the PSA ordered on 08/23/2020 was performed  at a [facility], but not a [facility] lab [facility]) and therefore do not  have the results; patient says he will get the results from the  non-[facility] lab.    MRI of 08/20/2020: This contrast was performed with Gadavist  administration.  Findings were significantly limited due to patient  motion.  However, prostate measured 5.4 x 5.0 x 4.6 cm right to left,  anterior-posterior and craniocaudal dimensions.  Prostate volume was  57 mLs.  There was hypertrophy of the transition zone.  Nodules were noted in the 1) right posterior medial peripheral zone of  the base and mid gland and right central zone; 2) left posterior  peripheral zone of the base through apex; 3) right posterior medial  lateral peripheral zone of the mid gland and apex.  In the description  of nodules #2 and 3, was noted tumor-capsule interface a greater than  1.0 cm suggesting extracapsular extension which were marked in key  images in the PACS system.  Seminal vesicles were unremarkable, pelvic lymph node was and there  was no free or loculated fluid collections in the pelvis.  Urinary  bladder appeared normal, visualized bowel appeared normal.  The bones  visualized showed no evidence of osseous metastasis.    Maurice Baker and his Baker are here to review the MRI Findings, which  were the following in a folder of images of interest, with arrows  pointing to the extracapsular extension.    IMPRESSION: Maurice Baker is a 75 year old Baker who presents with 6  cores out of 12 cores positive on prostate  biopsy, grade group 1 on  biopsy of 02/21/2020 with BPH but no current nocturia, here for  discussion of MRI findings.    PLAN: Discussion with Dr. Perfecto Kingdom and Dr. Eber Jones concerning the  additional MRI information of extracapsular extension, repeat biopsy  is warranted to see if there is increasing of the Gleason stage at  this point.  Extracapsular extension and more than 1 area but may be  difficult to do adequate permanent seed implant as would have to  implant much closer to the rectum and outside of the prostate itself.  Based on the findings of the new biopsy, we will determine the next  course of therapy, most likely to be external beam radiation therapy  and the possibility of androgen  deprivation therapy based on biopsy  findings.  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  Dr. Eber Jones is scheduled for a MRI fusion biopsy of his prostate using  the neuro navigation system on 09/03/2020.    Time spent with Maurice Baker was for 30 minutes.        Doris Cheadle, MD    Electronically signed by: Gaspar Bidding, MD  D: 08/24/2020, T: 6:55 PM    CC:  Louie Casa, MD, Lyn Hollingshead, MD, Dorthey Sawyer. Myra Gianotti, MD,  Ovid Curd, MD, Roni Bread, MD    The [place] Department of Radiation Oncology is  an [place] of Radiology (ACR).    This document was completed utilizing speech recognition software.  Grammatical errors, random word insertions, pronoun errors, and  incomplete sentences are an occasional consequence of this system due  to software limitations, ambient noise, and hardware issues. Any  formal questions or concerns about the content, text or information  contained within the body of this dictation should be directly  addressed to the provider for clarification.    PATIENT: Maurice Baker  DOB: 25-Jan-1946        cc:  Marcy Salvo, MD       [facility]       [address]       [place]  [address]         [place] Myra Gianotti, MD       [address]       [address]       [place] [address]         Louie Casa, MD       [place]       [place] [address]         Joshua B Nething, MD       [place] [address]         Kevin A. Modena Morrow, [place]       [place] [address]

## 2020-08-23 NOTE — Other (Unsigned)
Patient Acct Nbr: [id number]   Primary AUTH/CERT:   [place] Name: [place] name: [facility]  Primary Insurance Group Number: [id number]  Primary Insurance Plan Type: Health  Primary Insurance Policy Number: [id number]

## 2020-08-23 NOTE — Telephone Encounter (Signed)
I spoke with Dr. Durward Parcel.    Dr. Adair Laundry assigned MRI targets    Patient aware that we will be in contact    The next step would be to proceed with MRI-guided "fusion" biopsies, either in-office biopsy with pre-procedure Valium, or trans-perineal biopsy under general anesthesia at the [facility]    This can be scheduled.  If it is, please let me know and I will enter the pre-procedure orders    I am happy to discuss any further questions the patient may have, either virtual or in-office

## 2020-08-23 NOTE — Telephone Encounter (Signed)
I discussed this patient with Dr Durward Parcel    He was planning on brachytherapy however the MRI does show possible worse cancer than previous biopsy suggested    He will need MRI fusion biopsy, if this can be coordinated using this current MRI study

## 2020-08-24 NOTE — Telephone Encounter (Signed)
Patient is scheduled for office prostate biopsy 09/03/2020, 12 noon. He is aware of the t/d/l.     Please enter the pre-procedure medications.    Brachytherapy surgery for 09/19/2020 will be cancelled at this time.

## 2020-08-27 MED ORDER — DIAZEPAM 10 MG PO TABS
10 MG | ORAL_TABLET | ORAL | 0 refills | Status: AC
Start: 2020-08-27 — End: 2020-09-03

## 2020-08-27 MED ORDER — LEVOFLOXACIN 500 MG PO TABS
500 MG | ORAL_TABLET | ORAL | 0 refills | Status: AC
Start: 2020-08-27 — End: 2020-09-03

## 2020-08-27 NOTE — Telephone Encounter (Signed)
Noted, thank you

## 2020-08-27 NOTE — Telephone Encounter (Signed)
RX sent to pharmacy    Be sure that he is scheduled to received BOTH gentamicin and ceftriaxone

## 2020-09-03 ENCOUNTER — Ambulatory Visit: Admit: 2020-09-03 | Discharge: 2020-09-03 | Payer: MEDICARE | Attending: Urology

## 2020-09-03 DIAGNOSIS — C61 Malignant neoplasm of prostate: Secondary | ICD-10-CM

## 2020-09-03 MED ORDER — GENTAMICIN SULFATE 40 MG/ML IJ SOLN
40 MG/ML | Freq: Once | INTRAMUSCULAR | Status: AC
Start: 2020-09-03 — End: 2020-09-03
  Administered 2020-09-03: 17:00:00 160 mg via INTRAMUSCULAR

## 2020-09-03 MED ORDER — LIDOCAINE HCL 1% INJ (MIXTURES ONLY)
1 % | Freq: Once | INTRAMUSCULAR | Status: AC
Start: 2020-09-03 — End: 2020-09-03
  Administered 2020-09-03: 17:00:00 1000 mg via INTRAMUSCULAR

## 2020-09-03 NOTE — Progress Notes (Signed)
MRI-fusion transrectal prostate ultrasonography and prostate biopsy  Procedure Note    Pre-operative Diagnosis: Prostate Cancer    Post-operative Diagnosis: Prostate Cancer    Procedure Details   We had an extended discussion regarding the technique of MRI-fusion prostate biopsy. This process improves prostate cancer detection in patients with previous benign biopsy & rising PSA , AND in men diagnosed with low grade cancer who are having repeat biopsies as part of their "active surveillance" protocol. In both groups, the improved ability to identify suspicious regions-of-interest (based on T2-weighted imaging, dynamic contrast enhancement, diffusion-weighted imaging, etc) increases the likelihood that areas of high-grade cancer will be sampled. The patient denies having any metallic implants that would interfere with the MRI. Furthermore, the standard risks of prostate biopsy (bleeding, infection, pain, urinary retention, etc.) were discussed, questions were answered, and consent was obtained.    The risks, benefits, complications, treatment options, and expected outcomes were discussed with the patient. The patient concurred with the proposed plan, giving informed consent. The patient did take his pre-procedure medication as ordered.  He does have someone here to transport him home. A proper "time out" was performed before the procedure started.     Transrectral ultrasonography was performed without incident. The patient was placed in the lateral decubitus position. Xylocaine jelly was instilled into the rectum. Digital rectal exam revealed no obvious rectal or prostatic masses. The ultrasound probe was advanced into the rectum without incident. The prostate was imaged in transverse and sagittal view.  Images were acquired to determine prostate dimensions and prostate volume. A peri-prostatic anesthetic block was accomplished with infiltration near the per-prostatic nerves with 1% Xylocaine.    Findings:  Seminal  vesicles: normal size and architecture    Prostate: The lateral boundaries were intact. There were NO obvious intra-prostatic hypoechoic areas noted.      The prostate had the following dimensions:    WIDTH   (cm.):   5.33  HEIGHT  (cm.):  3.33        LENGTH (cm.):  4.89          VOLUME (ml):  45.4      PSA density: 0.14    The MRI study was active in the [facility] unit.  Image acquisition and sweep was performed without incident.  Image registration and segmentation was performed without incident.  Proper contouring and identification of previously-identified regions of interest was performed without incident.  Elastic deformation was used to further align the 2 images.     The regions-of-interest were selected first for biopsy.  Multiple biopsies were taken from the regions-of-interest (utilizing the ultrasound and MRI as a guidance) as detailed below with the spring-loaded biopsy needle.  Following this, the remainder of the biopsies were taken as shown         Biopsies were obtained from the following regions:     A) RIGHT BASE  B) RIGHT MID  C) RIGHT APEX  D) RIGHT LATERAL BASE  E) RIGHT LATERAL MID (MRI "REGION-OF-INTEREST" #1 , 2 CORES TAKEN)  F) RIGHT LATERAL APEX (MRI "REGION-OF-INTEREST" #3 , 2 CORES TAKEN)  G) LEFT BASE  H) LEFT MID  I) LEFT APEX  J) LEFT LATERAL BASE  K) LEFT LATERAL MID (MRI "REGION-OF-INTEREST" #2 , 2 CORES TAKEN)  L) LEFT LATERAL APEX                     Complications:  None. The probe was withdrawn and the patient tolerated the procedure well. Direct  compression of the rectum was performed and no excessive rectal or urethral bleeding could be identified.                 Plan: I will call the patient with the histology report.  He is to contact us with any fever,chills, urinary retention, or severe urinary or rectal bleeding. [facility] images reviewed with patient and wife. Questions answered    Marcy Salvo , M.D.    09/03/2020        PAST HISTORY :          08/20/2020: MRI: 57 ml.  Three PI-RADS 4 nodules in the right midgland/base, left base through apex, and right midgland/apex. These are all broad-based along the capsule and early extracapsular extension cannot be excluded.       02/21/2020: TRUS/Bx (49 ml., [place]):   Gleason GG#1 in 6/12 cores        08/23/2020: PSA: 6.200 ([facility])  01/13/2020: PSA: 7.900 (PCP)  09/06/2018: PSA: 5.800 (PCP)  12/01/2016: PSA: 6.300 (PCP)

## 2020-09-06 LAB — SURGICAL PATHOLOGY

## 2020-09-06 NOTE — Telephone Encounter (Signed)
The MRI fusion prostate biopsy results came back this afternoon showing several foci of Gleason GG#1 cancer.  There does not appear to be any increase in volume of cancer, and perhaps even a slight decrease in the volume of cancer.    I called the patient and explained the good news to him.  He had also reviewed the results in [place].    He had no difficulties following the biopsy.    I will be in touch with Dr. Durward Parcel and plans for therapy will be discussed

## 2020-09-12 ENCOUNTER — Ambulatory Visit

## 2020-09-19 ENCOUNTER — Inpatient Hospital Stay
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
## 2021-03-28 IMAGING — MR MRI LUMBAR SPINE WITHOUT CONTRAST
1 series · 1 of 1 positions shown · non-contrast
Comparison: none

﻿MRI OF THE LUMBAR SPINE:
HISTORY: Back pain.
TECHNIQUE: Multisequence T1 and T2 weighted images were obtained.

[Series 3: s-c scano · coronal · 6.0mm · 1.17mm/px · 1 of 1 slices shown]
[im 1/1]
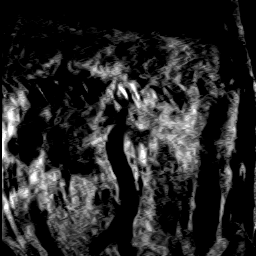

[1 of 1 positions shown; findings below may reference images not displayed]

FINDINGS: The conus medullaris appears normal.  The lordotic curvature of the lumbar spine is preserved.  No evidence for abnormal solid or cystic lesions is identified.  No prevertebral or paravertebral masses or fluid collections are seen and there is no evidence for abnormal marrow replacing lesion.  Segmental analysis of the lumbar spine is as follows:

At L1-2, there is no evidence for disc herniation, canal stenosis or neural foraminal stenosis.

At L2-3, there is bulging of the disc.  This results in an anterior impression on the thecal sac.  Mild left and mild right foraminal stenosis. There is no central canal stenosis. 

At L3-4, there is a right paracentral/neural foraminal disc herniation superimposed on a disc bulge. The disc herniation indents the ventral thecal sac and encroaches into the right neural foramen. Mild to moderate right foraminal stenosis. Mild left foraminal stenosis. Severe spinal canal stenosis.

At L4-5, there is bulging of the disc.  This results in an anterior impression on the thecal sac.  There are vertebral osteophytes and endplate degenerative changes. Moderate bilateral foraminal stenosis. Moderate spinal canal stenosis in both AP and transverse dimension.  

At L5-S1, there is a posterior central disc herniation superimposed on a disc bulge. There are vertebral osteophytes present; and the disc herniation extends beyond the osteophytes and indents the ventral thecal sac. Severe bilateral foraminal stenosis. Mild spinal canal stenosis.
IMPRESSION: 1. At L2-3, there is bulging of the disc.  This results in an anterior impression on the thecal sac.  Mild left and mild right foraminal stenosis. 

2. At L3-4, there is a right paracentral/neural foraminal disc herniation superimposed on a disc bulge. The disc herniation indents the ventral thecal sac and encroaches into the right neural foramen. Mild to moderate right foraminal stenosis. Mild left foraminal stenosis. Severe spinal canal stenosis. See Figure 1, series 4, image 3. The arrow points to the L3-4 disc herniation.

3. At L4-5, there is bulging of the disc.  This results in an anterior impression on the thecal sac.  There are vertebral osteophytes and endplate degenerative changes. Moderate bilateral foraminal stenosis. Moderate spinal canal stenosis in both AP and transverse dimension.  

4. At L5-S1, there is a posterior central disc herniation superimposed on a disc bulge. There are vertebral osteophytes present; and the disc herniation extends beyond the osteophytes and indents the ventral thecal sac. Severe bilateral foraminal stenosis. Mild spinal canal stenosis. See Figure 2, series 4, image 7. The arrow points to the L5-S1 disc herniation.

The definitions in this report, including definitions of disc bulge, herniation, protrusion, and extrusion, are from the following peer reviewed [place]: Lumbar Disc Nomenclature V2.0, Recommendations of the Combined Task Forces of the North American Spine Society, the American Society of Spine Radiology and the American Society of Neuroradiology, The Spine [place] [phone]. References to causation and permanency follow guidelines established by the American Medical Association. Note that a normal MRI does not exclude certain pathologies, including pathologies involving the nerves and facet joints. A normal MRI should not supersede abnormalities detected with physical exam. Disc herniations are contained herniated discs unless specifically identified as uncontained.

## 2022-11-24 ENCOUNTER — Ambulatory Visit: Admit: 2022-11-24 | Discharge: 2017-05-12 | Payer: MEDICAID

## 2022-11-24 DIAGNOSIS — I214 Non-ST elevation (NSTEMI) myocardial infarction: Secondary | ICD-10-CM

## 2022-11-28 ENCOUNTER — Ambulatory Visit: Admit: 2022-11-28 | Payer: MEDICAID

## 2022-11-28 VITALS — BP 100/53 | HR 73 | Temp 97.10000°F | Resp 16 | Ht 63.0 in | Wt 129.7 lb

## 2022-11-28 DIAGNOSIS — I255 Ischemic cardiomyopathy: Secondary | ICD-10-CM

## 2022-12-10 ENCOUNTER — Encounter: Admit: 2017-06-22 | Payer: MEDICAID

## 2023-01-28 ENCOUNTER — Ambulatory Visit: Admit: 2023-01-28 | Discharge: 2017-06-18 | Payer: Medicaid (Managed Care)

## 2023-01-28 DIAGNOSIS — I255 Ischemic cardiomyopathy: Secondary | ICD-10-CM

## 2023-03-16 ENCOUNTER — Encounter: Admit: 2017-09-22 | Discharge: 2017-09-22 | Payer: Medicaid (Managed Care)

## 2023-04-03 ENCOUNTER — Ambulatory Visit: Admit: 2023-04-03 | Payer: Medicaid (Managed Care)

## 2023-05-08 ENCOUNTER — Encounter: Admit: 2017-08-05 | Payer: Medicaid (Managed Care)
